# Patient Record
Sex: Male | Born: 1990
Health system: Southern US, Community
[De-identification: ages and names within clinical notes are randomized; demographics above are authoritative.]

## PROBLEM LIST (undated history)

## (undated) DIAGNOSIS — F1120 Opioid dependence, uncomplicated: Secondary | ICD-10-CM

## (undated) DIAGNOSIS — N200 Calculus of kidney: Secondary | ICD-10-CM

## (undated) DIAGNOSIS — F1111 Opioid abuse, in remission: Secondary | ICD-10-CM

## (undated) DIAGNOSIS — I2699 Other pulmonary embolism without acute cor pulmonale: Secondary | ICD-10-CM

## (undated) DIAGNOSIS — R300 Dysuria: Secondary | ICD-10-CM

## (undated) DIAGNOSIS — I10 Essential (primary) hypertension: Secondary | ICD-10-CM

## (undated) DIAGNOSIS — Z79891 Long term (current) use of opiate analgesic: Secondary | ICD-10-CM

## (undated) HISTORY — PX: ANKLE SURGERY: SHX546

## (undated) HISTORY — PX: JOINT REPLACEMENT: SHX530

---

## 2014-08-26 ENCOUNTER — Emergency Department (HOSPITAL_COMMUNITY)
Admission: EM | Admit: 2014-08-26 | Discharge: 2014-08-26 | Disposition: A | Discharge status: Home / Self Care | Payer: Medicaid - Out of State | Attending: Emergency Medicine | Admitting: Emergency Medicine

## 2014-08-26 ENCOUNTER — Encounter (HOSPITAL_COMMUNITY): Payer: Self-pay

## 2014-08-26 DIAGNOSIS — K029 Dental caries, unspecified: Secondary | ICD-10-CM | POA: Insufficient documentation

## 2014-08-26 DIAGNOSIS — K006 Disturbances in tooth eruption: Secondary | ICD-10-CM | POA: Insufficient documentation

## 2014-08-26 MED ORDER — BUPIVACAINE-EPINEPHRINE (PF) 0.5% -1:200000 IJ SOLN
1.8000 mL | Freq: Once | INTRAMUSCULAR | Status: AC
  Administered 2014-08-26: 1.8 mL
  Filled 2014-08-26: qty 1.8

## 2014-08-26 MED ORDER — IBUPROFEN 800 MG PO TABS: 800.0000 mg | ORAL_TABLET | Freq: Three times a day (TID) | ORAL | Status: DC

## 2014-08-26 MED ORDER — PENICILLIN V POTASSIUM 500 MG PO TABS: 500.0000 mg | ORAL_TABLET | Freq: Three times a day (TID) | ORAL | Status: DC

## 2014-08-26 NOTE — Discharge Instructions (Signed)

## 2014-08-26 NOTE — ED Provider Notes (Signed)
CSN: [id number]     Arrival date & time 08/26/14  1140 History  This chart was scribed for non-physician practitioner, Domenic Moras, PA-C,working with Virgel Manifold, MD, by Marlowe Kays, ED Scribe. This patient was seen in room WTR6/WTR6 and the patient's care was started at 12:51 PM.  Chief Complaint  Patient presents with  . Dental Pain   Patient is a 24 y.o. male presenting with tooth pain. The history is provided by the patient and medical records. No language interpreter was used.  Dental Pain Associated symptoms: facial swelling   Associated symptoms: no fever     HPI Comments:  Glen Lambert is a 24 y.o. male who presents to the Emergency Department complaining of severe right lower dental pain that began yesterday. Pt states approximately 1.5 years ago the cap came off from the tooth and he has not had any dental care since. He states he does not have a dentist. Pt rates the pain at 10/10. He has taken Ibuprofen for the pain with moderate relief of the pain. Chewing and cold air makes the pain worse. Ibuprofen has been alleviating the pain for short periods of time. Denies fever, chills, drainage or bleeding of the tooth, nausea or vomiting. Denies chewing tobacco. Endorses smoking 0.5 PPD of cigarettes.   No past medical history on file. Past Surgical History  Procedure Laterality Date  . Ankle surgery     No family history on file. History  Substance Use Topics  . Smoking status: Not on file  . Smokeless tobacco: Not on file  . Alcohol Use: Not on file    Review of Systems  Constitutional: Negative for fever and chills.  HENT: Positive for dental problem and facial swelling.   Gastrointestinal: Negative for nausea and vomiting.    Allergies  Review of patient's allergies indicates no known allergies.  Home Medications   Prior to Admission medications   Not on File   Triage Vitals: BP 144/98 mmHg  Pulse 88  Temp(Src) 99.7 F (37.6 C) (Oral)  Resp 18  SpO2  99% Physical Exam  Constitutional: He is oriented to person, place, and time. He appears well-developed and well-nourished.  HENT:  Head: Normocephalic and atraumatic.  Mouth/Throat: No trismus in the jaw. Abnormal dentition. Dental caries present. No dental abscesses.  Tooth #31 with significant dental decay. No erythema, swelling or drainage noted. No obvious abscess.  Eyes: EOM are normal.  Neck: Normal range of motion.  Cardiovascular: Normal rate.   Pulmonary/Chest: Effort normal.  Musculoskeletal: Normal range of motion.  Neurological: He is alert and oriented to person, place, and time.  Skin: Skin is warm and dry.  Psychiatric: He has a normal mood and affect. His behavior is normal.  Nursing note and vitals reviewed.   ED Course  NERVE BLOCK Date/Time: 08/26/2014 1:26 PM Performed by: Domenic Moras Authorized by: Domenic Moras Consent: Verbal consent obtained. Risks and benefits: risks, benefits and alternatives were discussed Consent given by: patient Patient understanding: patient states understanding of the procedure being performed Patient consent: the patient's understanding of the procedure matches consent given Patient identity confirmed: verbally with patient and arm band Time out: Immediately prior to procedure a "time out" was called to verify the correct patient, procedure, equipment, support staff and site/side marked as required. Indications: pain relief Body area: face/mouth Nerve: inferior alveolar Laterality: right Patient sedated: no Patient position: sitting Needle gauge: 27 G Local anesthetic: bupivacaine 0.5% with epinephrine Anesthetic total: 1.8 ml Outcome: pain improved   (  including critical care time) DIAGNOSTIC STUDIES: Oxygen Saturation is 99% on RA, normal by my interpretation.   COORDINATION OF CARE: 12:57 PM- Will administer dental block and prescribe antibiotic and pain medication. Will give dental referral. Pt verbalizes understanding  and agrees to plan.     Medications - No data to display  Labs Review Labs Reviewed - No data to display  Imaging Review No results found.   EKG Interpretation None      MDM   Final diagnoses:  Pain due to dental caries    BP 144/98 mmHg  Pulse 88  Temp(Src) 99.7 F (37.6 C) (Oral)  Resp 18  SpO2 99%   I personally performed the services described in this documentation, which was scribed in my presence. The recorded information has been reviewed and is accurate.    Domenic Moras, PA-C 08/26/14 [place], MD 08/26/14 [phone]

## 2014-08-26 NOTE — ED Notes (Signed)
Pt  C/o RT lower tooth pain since yesterday.  Swelling to face began this am.

## 2015-07-15 ENCOUNTER — Encounter (HOSPITAL_COMMUNITY): Payer: Self-pay | Admitting: Emergency Medicine

## 2015-07-15 ENCOUNTER — Emergency Department (HOSPITAL_COMMUNITY)
Admission: EM | Admit: 2015-07-15 | Discharge: 2015-07-15 | Disposition: A | Discharge status: Home / Self Care | Payer: No Typology Code available for payment source | Attending: Emergency Medicine | Admitting: Emergency Medicine

## 2015-07-15 DIAGNOSIS — S0592XA Unspecified injury of left eye and orbit, initial encounter: Secondary | ICD-10-CM | POA: Insufficient documentation

## 2015-07-15 DIAGNOSIS — Y998 Other external cause status: Secondary | ICD-10-CM | POA: Insufficient documentation

## 2015-07-15 DIAGNOSIS — Y92410 Unspecified street and highway as the place of occurrence of the external cause: Secondary | ICD-10-CM | POA: Insufficient documentation

## 2015-07-15 DIAGNOSIS — H5712 Ocular pain, left eye: Secondary | ICD-10-CM

## 2015-07-15 DIAGNOSIS — M545 Low back pain, unspecified: Secondary | ICD-10-CM

## 2015-07-15 DIAGNOSIS — Y9389 Activity, other specified: Secondary | ICD-10-CM | POA: Insufficient documentation

## 2015-07-15 DIAGNOSIS — Z791 Long term (current) use of non-steroidal anti-inflammatories (NSAID): Secondary | ICD-10-CM | POA: Diagnosis not present

## 2015-07-15 DIAGNOSIS — Z792 Long term (current) use of antibiotics: Secondary | ICD-10-CM | POA: Insufficient documentation

## 2015-07-15 DIAGNOSIS — S3992XA Unspecified injury of lower back, initial encounter: Secondary | ICD-10-CM | POA: Diagnosis not present

## 2015-07-15 MED ORDER — METHOCARBAMOL 500 MG PO TABS: 500.0000 mg | ORAL_TABLET | Freq: Two times a day (BID) | ORAL | Status: DC

## 2015-07-15 MED ORDER — IBUPROFEN 800 MG PO TABS
800.0000 mg | ORAL_TABLET | Freq: Once | ORAL | Status: DC
  Filled 2015-07-15: qty 1

## 2015-07-15 MED ORDER — TETRACAINE HCL 0.5 % OP SOLN
1.0000 [drp] | Freq: Once | OPHTHALMIC | Status: AC
  Administered 2015-07-15: 1 [drp] via OPHTHALMIC
  Filled 2015-07-15: qty 4

## 2015-07-15 MED ORDER — FLUORESCEIN SODIUM 1 MG OP STRP
1.0000 | ORAL_STRIP | Freq: Once | OPHTHALMIC | Status: AC
  Administered 2015-07-15: 1 via OPHTHALMIC
  Filled 2015-07-15: qty 1

## 2015-07-15 MED ORDER — IBUPROFEN 800 MG PO TABS: 800.0000 mg | ORAL_TABLET | Freq: Three times a day (TID) | ORAL | Status: DC
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
## 2015-07-15 NOTE — Discharge Instructions (Signed)
Take medications as prescribed. Return to the emergency room for worsening condition or new concerning symptoms. Follow up with your regular doctor. If you don't have a regular doctor use one of the numbers below to establish a primary care doctor. ° ° °Emergency Department Resource Guide °1) Find a Doctor and Pay Out of Pocket °Although you won't have to find out who is covered by your insurance plan, it is a good idea to ask around and get recommendations. You will then need to call the office and see if the doctor you have chosen will accept you as a new patient and what types of options they offer for patients who are self-pay. Some doctors offer discounts or will set up payment plans for their patients who do not have insurance, but you will need to ask so you aren't surprised when you get to your appointment. ° °2) Contact Your Local Health Department °Not all health departments have doctors that can see patients for sick visits, but many do, so it is worth a call to see if yours does. If you don't know where your local health department is, you can check in your phone book. The CDC also has a tool to help you locate your state's health department, and many state websites also have listings of all of their local health departments. ° °3) Find a Walk-in Clinic °If your illness is not likely to be very severe or complicated, you may want to try a walk in clinic. These are popping up all over the country in pharmacies, drugstores, and shopping centers. They're usually staffed by nurse practitioners or physician assistants that have been trained to treat common illnesses and complaints. They're usually fairly quick and inexpensive. However, if you have serious medical issues or chronic medical problems, these are probably not your best option. ° °No Primary Care Doctor: °- Call [facility] at  832-8000 - they can help you locate a primary care doctor that  accepts your insurance, provides certain services,  etc. °- Physician Referral Service- 1-800-533-3463 ° °Emergency Department Resource Guide °1) Find a Doctor and Pay Out of Pocket °Although you won't have to find out who is covered by your insurance plan, it is a good idea to ask around and get recommendations. You will then need to call the office and see if the doctor you have chosen will accept you as a new patient and what types of options they offer for patients who are self-pay. Some doctors offer discounts or will set up payment plans for their patients who do not have insurance, but you will need to ask so you aren't surprised when you get to your appointment. ° °2) Contact Your Local Health Department °Not all health departments have doctors that can see patients for sick visits, but many do, so it is worth a call to see if yours does. If you don't know where your local health department is, you can check in your phone book. The CDC also has a tool to help you locate your state's health department, and many state websites also have listings of all of their local health departments. ° °3) Find a Walk-in Clinic °If your illness is not likely to be very severe or complicated, you may want to try a walk in clinic. These are popping up all over the country in pharmacies, drugstores, and shopping centers. They're usually staffed by nurse practitioners or physician assistants that have been trained to treat common illnesses and complaints. They're usually fairly   quick and inexpensive. However, if you have serious medical issues or chronic medical problems, these are probably not your best option. ° °No Primary Care Doctor: °- Call [facility] at  832-8000 - they can help you locate a primary care doctor that  accepts your insurance, provides certain services, etc. °- Physician Referral Service- 1-800-533-3463 ° °Chronic Pain Problems: °Organization         Address  Phone   Notes  °[facility]  ([phone]) [phone] Patients need to be referred by  their primary care doctor.  ° °Medication Assistance: °Organization         Address  Phone   Notes  °[place] [facility] [address] °[place], [place] [address] [phone]) [phone] --Must be a resident of [place] °-- Must have NO insurance coverage whatsoever (no Medicaid/ Medicare, etc.) °-- The pt. MUST have a primary care doctor that directs their care regularly and follows them in the community °  °[facility]  (866) 331-1348   °[employer]  (888) 892-1162   ° °Agencies that provide inexpensive medical care: °Organization         Address  Phone   Notes  °[facility]  ([phone]) [phone]   °[facility]    (336) 832-7272   °[facility] [address] °[place], [place] [address] ([phone]) 832-4777   °[facility] [address], °[place] ([phone]) [phone]   °[facility]    (336) 373-0678   °[facility]    (336) 272-1050   °[facility] ° [address], [place] Phone:  (336) [phone], Fax:  (336) [phone] Hours of Operation:  9 am - 6 pm, M-F.  Also accepts Medicaid/Medicare and self-pay.  °[facility] ° [address], [place] Phone: (336) [phone], Fax: ([phone]) [phone]. Hours of Operation:  8:30 am - 5:30 pm, M-F.  Also accepts Medicaid and self-pay.  °[facility] [place] [address], [place] Phone: ([phone]) [phone]   °[facility] [address], [place], [place] (336)723-1848, Ext. 123 Mondays & Thursdays: 7-9 AM.  First 15 patients are seen on a first come, first serve basis. °  ° °Medicaid-accepting [place] Providers: ° °Organization         Address  Phone   Notes  °[facility] [address], [place] ([phone]) [phone] Also accepts self-pay patients.  °[facility] [address], [place] ° ([phone]) [phone]   °[facility]  [address], [place] ([phone]) [phone]   °[facility] [address] [place] [address], [place] (336) [phone]   °[facility] [address], [place]  ° ([phone]) [phone] Only accepts [employer] Medicaid patients after they have their name applied to their card.  ° °Self-Pay (no insurance) in [place]: ° °Organization         Address  Phone   Notes  °Sickle Cell Patients, [facility] [address], [place] ([phone]) [phone]   °[facility] [address], [place] ([phone]) [phone]   °[facility] [place] ° [address], [place] ([phone]) [phone]   °[facility]/Dr. Osei-Bonsu ° [address] [place] [address],  or [address], [place] [phone]) [phone] Phone number   for both [place] and [place] locations is the same.  °[facility] [address], [place] ([phone]) [phone]   °[facility] [address] [place] [address], [place] or [address] ([phone]) [phone] °([phone]) [phone]   °[facility] [address], [place] (336) [phone], phone; ([phone]) [phone], fax Sees patients 1st and 3rd Saturday of every month.  Must not qualify for public or private insurance (i.e. Medicaid, Medicare, [place] Health Choice, Veterans' Benefits) • Household income should be no more than 200% of the poverty level •The clinic cannot treat you if you are pregnant or think you are pregnant • Sexually transmitted diseases are not treated at the clinic.  ° ° ° °
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
## 2016-04-25 ENCOUNTER — Emergency Department (HOSPITAL_COMMUNITY): Payer: Self-pay

## 2016-04-25 ENCOUNTER — Inpatient Hospital Stay (HOSPITAL_COMMUNITY)
Admission: EM | Admit: 2016-04-25 | Discharge: 2016-04-29 | DRG: 175 | Disposition: A | Discharge status: Home / Self Care | Payer: Self-pay | Attending: Internal Medicine | Admitting: Internal Medicine

## 2016-04-25 DIAGNOSIS — Z86711 Personal history of pulmonary embolism: Secondary | ICD-10-CM | POA: Diagnosis present

## 2016-04-25 DIAGNOSIS — F1120 Opioid dependence, uncomplicated: Secondary | ICD-10-CM

## 2016-04-25 DIAGNOSIS — Z79891 Long term (current) use of opiate analgesic: Secondary | ICD-10-CM

## 2016-04-25 DIAGNOSIS — Z720 Tobacco use: Secondary | ICD-10-CM

## 2016-04-25 DIAGNOSIS — M79669 Pain in unspecified lower leg: Secondary | ICD-10-CM | POA: Diagnosis present

## 2016-04-25 DIAGNOSIS — R000 Tachycardia, unspecified: Secondary | ICD-10-CM | POA: Diagnosis present

## 2016-04-25 DIAGNOSIS — F17213 Nicotine dependence, cigarettes, with withdrawal: Secondary | ICD-10-CM | POA: Diagnosis present

## 2016-04-25 DIAGNOSIS — I2699 Other pulmonary embolism without acute cor pulmonale: Principal | ICD-10-CM | POA: Diagnosis present

## 2016-04-25 DIAGNOSIS — F1111 Opioid abuse, in remission: Secondary | ICD-10-CM | POA: Diagnosis present

## 2016-04-25 DIAGNOSIS — J9601 Acute respiratory failure with hypoxia: Secondary | ICD-10-CM | POA: Diagnosis present

## 2016-04-25 DIAGNOSIS — R042 Hemoptysis: Secondary | ICD-10-CM | POA: Diagnosis present

## 2016-04-25 DIAGNOSIS — R079 Chest pain, unspecified: Secondary | ICD-10-CM | POA: Diagnosis present

## 2016-04-25 DIAGNOSIS — D649 Anemia, unspecified: Secondary | ICD-10-CM | POA: Diagnosis present

## 2016-04-25 HISTORY — DX: Opioid abuse, in remission: F11.11

## 2016-04-25 HISTORY — DX: Long term (current) use of opiate analgesic: Z79.891

## 2016-04-25 HISTORY — DX: Opioid dependence, uncomplicated: F11.20

## 2016-04-25 NOTE — ED Notes (Signed)
MD Pollina at bedside. 

## 2016-04-25 NOTE — ED Notes (Signed)
Patient arrives via EMS from home with complaint of chest pain as Code STEMI. Onset of pain was yesterday. The pain resolved last night and patient was able to go to sleep. Today the pain commenced and he began to feel very short of breath. EMS found patient exhibiting Levine's sign, tachycardic, and hypertensive. PTA they administered 324mg  ASA and 1 SL NTG 0.4mg  tab. Patient's chest pain was reduced after NTG administration and patient's VS improved. Initial BP per EMS was 167/111 with HR @ 135.

## 2016-04-25 NOTE — ED Provider Notes (Signed)
[facility] DEPT Provider Note   CSN: [id number] Arrival date & time: 04/25/16  2334  By signing my name below, I, [name], attest that this documentation has been prepared under the direction and in the presence of Orpah Greek, MD. Electronically Signed: [name], ED Scribe. 04/25/16. 11:49 PM.    History   Chief Complaint Chief Complaint  Patient presents with  . Code STEMI    HPI Glen Lambert is a 25 y.o. male brought in by ambulance who presents to the Emergency Department complaining of moderate, left-sided CP with radiation to the left shoulder that initially began yesterday and worsened this morning upon waking up. Pt states his CP gradually worsened throughout the day today and he also experienced SOB. He states his pain is worsened with breathing. Per EMS, pt was hypertensive (169/111), tachycardic (130 bpm) and hypoxic (low 90s) on their arrival. EMS states pt was placed on 4L O2 with improvement of his SpO2 to 98% en route. Pt was given NTG and ASA en route with temporary relief of his CP, per EMS. EMS also states pts BP improved to 129/80 after NTG administration. No FHx early heart disease. No h/o cardiac disease, MI. Pt denies nausea, vomiting, diarrhea, diaphoresis, cough. Pt states he is a current occasional smoker and drinker. He denies illicit drug use. He is currently taking Methadone.     The history is provided by the patient and the EMS personnel. No language interpreter was used.    Past Medical History:  Diagnosis Date  . 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 Opioid abuse, in remission     Patient Active Problem List   Diagnosis Date Noted  . PE (pulmonary thromboembolism) ([place]) 04/26/2016    Past Surgical History:  Procedure Laterality Date  . ANKLE SURGERY         Home Medications    Prior to Admission medications   Medication Sig Start Date End Date Taking? Authorizing Provider  methadone (DOLOPHINE) 10 MG/ML  solution Take 90 mg by mouth every 8 (eight) hours.   Yes Historical Provider, MD  naproxen sodium (ANAPROX) 220 MG tablet Take 220 mg by mouth 2 (two) times daily as needed (for pain).   Yes Historical Provider, MD  sodium-potassium bicarbonate (ALKA-SELTZER GOLD) TBEF dissolvable tablet Take 1 tablet by mouth daily as needed (for pain).   Yes Historical Provider, MD  ibuprofen (ADVIL,MOTRIN) 800 MG tablet Take 1 tablet (800 mg total) by mouth 3 (three) times daily. Patient not taking: Reported on 04/25/2016 08/26/14   Domenic Moras, PA-C  ibuprofen (ADVIL,MOTRIN) 800 MG tablet Take 1 tablet (800 mg total) by mouth 3 (three) times daily. Patient not taking: Reported on 04/25/2016 07/15/15   Olivia Canter Sam, PA-C  methocarbamol (ROBAXIN) 500 MG tablet Take 1 tablet (500 mg total) by mouth 2 (two) times daily. Patient not taking: Reported on 04/25/2016 07/15/15   Olivia Canter Sam, PA-C  penicillin v potassium (VEETID) 500 MG tablet Take 1 tablet (500 mg total) by mouth 3 (three) times daily. Patient not taking: Reported on 04/25/2016 08/26/14   Domenic Moras, PA-C    Family History History reviewed. No pertinent family history.  Social History Social History  Substance Use Topics  . Smoking status: Current Every Day Smoker  . Smokeless tobacco: Never Used  . Alcohol use Yes     Comment: Occasional     Allergies   Review of patient's allergies indicates no known allergies.   Review of Systems Review  of Systems  Constitutional: Negative for diaphoresis.  Respiratory: Positive for shortness of breath. Negative for cough.   Cardiovascular: Positive for chest pain.  Gastrointestinal: Negative for diarrhea, nausea and vomiting.  All other systems reviewed and are negative.    Physical Exam Updated Vital Signs BP 139/81   Pulse 81   Temp 101 F (38.3 C) (Oral)   Resp (!) 31   Ht 6\' 2"  (1.88 m)   Wt 255 lb (115.7 kg)   SpO2 98%   BMI 32.74 kg/m   Physical Exam  Constitutional: He is oriented to  person, place, and time. He appears well-developed and well-nourished. No distress.  HENT:  Head: Normocephalic and atraumatic.  Right Ear: Hearing normal.  Left Ear: Hearing normal.  Nose: Nose normal.  Mouth/Throat: Oropharynx is clear and moist and mucous membranes are normal.  Eyes: Conjunctivae and EOM are normal. Pupils are equal, round, and reactive to light.  Neck: Normal range of motion. Neck supple.  Cardiovascular: Regular rhythm and normal heart sounds.  Tachycardia present.  Exam reveals no gallop and no friction rub.   No murmur heard. Pulmonary/Chest: Effort normal and breath sounds normal. No respiratory distress. He exhibits no tenderness.  Abdominal: Soft. Normal appearance and bowel sounds are normal. There is no hepatosplenomegaly. There is no tenderness. There is no rebound, no guarding, no tenderness at McBurney's point and negative Murphy's sign. No hernia.  Musculoskeletal: Normal range of motion.  Neurological: He is alert and oriented to person, place, and time. He has normal strength. No cranial nerve deficit or sensory deficit. Coordination normal. GCS eye subscore is 4. GCS verbal subscore is 5. GCS motor subscore is 6.  Skin: Skin is warm and dry. No rash noted.  Psychiatric: He has a normal mood and affect. His behavior is normal. Thought content normal.  Nursing note and vitals reviewed.    ED Treatments / Results   DIAGNOSTIC STUDIES: Oxygen Saturation is 98% on [place] 4L, normal by my interpretation.    COORDINATION OF CARE: 11:41 PM Discussed treatment plan with pt at bedside which includes EKG and pt agreed to plan.  11:49 PM Consulted with cardiology, who recommends cardiac work-up, troponin.      Labs (all labs ordered are listed, but only abnormal results are displayed) Labs Reviewed  CBC WITH DIFFERENTIAL/PLATELET - Abnormal; Notable for the following:       Result Value   Hemoglobin 12.6 (*)    HCT 37.1 (*)    All other components within  normal limits  COMPREHENSIVE METABOLIC PANEL - Abnormal; Notable for the following:    Glucose, Bld 119 (*)    All other components within normal limits  PROTIME-INR  BRAIN NATRIURETIC PEPTIDE  RAPID URINE DRUG SCREEN, HOSP PERFORMED  ANTITHROMBIN III  PROTEIN C ACTIVITY  PROTEIN C, TOTAL  PROTEIN S ACTIVITY  PROTEIN S, TOTAL  LUPUS ANTICOAGULANT PANEL  BETA-2-GLYCOPROTEIN I ABS, IGG/M/A  HOMOCYSTEINE  FACTOR 5 LEIDEN  PROTHROMBIN GENE MUTATION  CARDIOLIPIN ANTIBODIES, IGG, IGM, IGA  I-STAT TROPOININ, ED    EKG  EKG Interpretation  Date/Time:  Friday April 25 2016 23:41:18 EDT Ventricular Rate:  111 PR Interval:    QRS Duration: 88 QT Interval:  315 QTC Calculation: 428 R Axis:   9 Text Interpretation:  Sinus tachycardia Borderline ST elevation, anterior leads Baseline wander in lead(s) V1 No previous tracing Confirmed by Betsey Holiday  MD, CHRISTOPHER [id number]) on 04/26/2016 12:23:14 AM       Radiology Ct Angio Chest  Pe W Or Wo Contrast  Result Date: 04/26/2016 CLINICAL DATA:  Chest pain and shortness of breath EXAM: CT ANGIOGRAPHY CHEST WITH CONTRAST TECHNIQUE: Multidetector CT imaging of the chest was performed using the standard protocol during bolus administration of intravenous contrast. Multiplanar CT image reconstructions and MIPs were obtained to evaluate the vascular anatomy. CONTRAST:  100 mL Isovue 370 IV COMPARISON:  Chest radiograph 04/26/2016 better contrast 100 mL Isovue 370 IV FINDINGS: Cardiovascular: There is satisfactory opacification of the pulmonary arteries to the segmental level. There is non opacification of the left lower lobe posterior and medial basal segment pulmonary arteries. Additionally, there is a suspected filling defect within a subsegmental branch of the anterior basal segment left lower lobe pulmonary artery. The remainder of the pulmonary arterial tree is normal. The main pulmonary artery is upper limits normal for size, measuring 2.9 cm at the  bifurcation. There is no CT evidence of acute right heart strain. The visualized aorta is normal. The RV:LV ratio measures approximately 0.8. There is a normal 3-vessel arch branching pattern. Heart size is normal, without pericardial effusion. Mediastinum/Nodes: No mediastinal, hilar or axillary lymphadenopathy. The visualized thyroid and thoracic esophageal course are unremarkable. Lungs/Pleura: There are bibasilar opacities, left greater than right. No pneumothorax or pleural effusion. No pulmonary nodules or masses. Upper Abdomen: Contrast bolus timing is not optimized for evaluation of the abdominal organs. Within this limitation, the visualized organs of the upper abdomen are normal. Musculoskeletal: No chest wall abnormality. No acute or significant osseous findings. Bilateral gynecomastia. Review of the MIP images confirms the above findings. IMPRESSION: 1. Pulmonary emboli within the left lower lobe posterior basal and medial basal segmental pulmonary arteries and suspected embolus within a subsegmental branch of the left lower lobe anterior basal segmental artery. 2. No evidence of right heart strain. 3. Left basilar airspace opacities, likely early pulmonary infarct. These results were called by telephone at the time of interpretation on 04/26/2016 at 5:06 am to Dr. Joseph Berkshire , who verbally acknowledged these results. Electronically Signed   By: Ulyses Jarred M.D.   On: 04/26/2016 05:10   Dg Chest Port 1 View  Result Date: 04/26/2016 CLINICAL DATA:  Acute onset of generalized chest pain. Code ST-elevation myocardial infarction. Severe shortness of breath. Initial encounter. EXAM: PORTABLE CHEST 1 VIEW COMPARISON:  None. FINDINGS: The lungs are mildly hypoexpanded. Mild bibasilar opacity may reflect atelectasis or possibly minimal interstitial edema. There is no evidence of pleural effusion or pneumothorax. The cardiomediastinal silhouette is borderline normal in size. No acute osseous  abnormalities are seen. IMPRESSION: Lungs mildly hypoexpanded. Mild bibasilar opacity may reflect atelectasis or possibly minimal interstitial edema. Electronically Signed   By: Garald Balding M.D.   On: 04/26/2016 00:44    Procedures Procedures (including critical care time)  Medications Ordered in ED Medications  iopamidol (ISOVUE-370) 76 % injection (not administered)  gi cocktail (Maalox,Lidocaine,Donnatal) (30 mLs Oral Given 04/26/16 0412)     Initial Impression / Assessment and Plan / ED Course  I have reviewed the triage vital signs and the nursing notes.  Pertinent labs & imaging results that were available during my care of the patient were reviewed by me and considered in my medical decision making (see chart for details).  Clinical Course    Patient presents to the emergency department for evaluation of chest pain. Patient brought in as a code STEMI by EMS. Review of the EKG did reveal elevations in V1 and V2, but there were no reciprocal changes. This  was reviewed with Dr. Einar Gip, who did agree that this was not consistent with STEMI. He recommended routine workup. Patient was noted to be mildly hypoxic for EMS and had a sharp and pleuritic component to his pain. CT angiography was therefore performed to further evaluate. This did show evidence of pulmonary embolism. Patient does have a low-grade fever here in the ER 2, likely secondary to slight pulmonary infarct. Patient will be placed on heparin and admitted to the hospitalist service.  Final Clinical Impressions(s) / ED Diagnoses   Final diagnoses:  PE (pulmonary thromboembolism) ([place])    New Prescriptions New Prescriptions   No medications on file    I personally performed the services described in this documentation, which was scribed in my presence. The recorded information has been reviewed and is accurate.    Orpah Greek, MD 04/26/16 [phone]

## 2016-04-25 NOTE — ED Notes (Signed)
Blood collected by Gwynne Edinger.

## 2016-04-26 ENCOUNTER — Emergency Department (HOSPITAL_COMMUNITY): Payer: Self-pay

## 2016-04-26 ENCOUNTER — Encounter (HOSPITAL_COMMUNITY): Payer: Self-pay | Admitting: Emergency Medicine

## 2016-04-26 DIAGNOSIS — Z720 Tobacco use: Secondary | ICD-10-CM

## 2016-04-26 DIAGNOSIS — I2699 Other pulmonary embolism without acute cor pulmonale: Secondary | ICD-10-CM | POA: Diagnosis present

## 2016-04-26 DIAGNOSIS — Z86711 Personal history of pulmonary embolism: Secondary | ICD-10-CM | POA: Diagnosis present

## 2016-04-26 DIAGNOSIS — F1111 Opioid abuse, in remission: Secondary | ICD-10-CM

## 2016-04-26 DIAGNOSIS — R079 Chest pain, unspecified: Secondary | ICD-10-CM | POA: Diagnosis present

## 2016-04-26 DIAGNOSIS — F1120 Opioid dependence, uncomplicated: Secondary | ICD-10-CM

## 2016-04-26 LAB — CBC WITH DIFFERENTIAL/PLATELET
BASOS ABS: 0 10*3/uL (ref 0.0–0.1)
Basophils Relative: 0 %
Eosinophils Absolute: 0.1 10*3/uL (ref 0.0–0.7)
Eosinophils Relative: 1 %
HEMATOCRIT: 37.1 % — AB (ref 39.0–52.0)
HEMOGLOBIN: 12.6 g/dL — AB (ref 13.0–17.0)
Lymphocytes Relative: 25 %
Lymphs Abs: 1.9 10*3/uL (ref 0.7–4.0)
MCH: 29.4 pg (ref 26.0–34.0)
MCHC: 34 g/dL (ref 30.0–36.0)
MCV: 86.7 fL (ref 78.0–100.0)
Monocytes Absolute: 0.5 10*3/uL (ref 0.1–1.0)
Monocytes Relative: 6 %
NEUTROS ABS: 5 10*3/uL (ref 1.7–7.7)
Neutrophils Relative %: 68 %
Platelets: 218 10*3/uL (ref 150–400)
RBC: 4.28 MIL/uL (ref 4.22–5.81)
RDW: 13.2 % (ref 11.5–15.5)
WBC: 7.4 10*3/uL (ref 4.0–10.5)

## 2016-04-26 LAB — ANTITHROMBIN III: AntiThromb III Func: 113 % (ref 75–120)

## 2016-04-26 LAB — I-STAT TROPONIN, ED: Troponin i, poc: 0 ng/mL (ref 0.00–0.08)

## 2016-04-26 LAB — COMPREHENSIVE METABOLIC PANEL
ALBUMIN: 3.8 g/dL (ref 3.5–5.0)
ALT: 21 U/L (ref 17–63)
AST: 23 U/L (ref 15–41)
Alkaline Phosphatase: 50 U/L (ref 38–126)
Anion gap: 7 (ref 5–15)
BILIRUBIN TOTAL: 0.5 mg/dL (ref 0.3–1.2)
BUN: 10 mg/dL (ref 6–20)
CO2: 28 mmol/L (ref 22–32)
CREATININE: 0.92 mg/dL (ref 0.61–1.24)
Calcium: 9.4 mg/dL (ref 8.9–10.3)
Chloride: 105 mmol/L (ref 101–111)
GFR calc Af Amer: >60 mL/min (ref >60)
GFR calc non Af Amer: >60 mL/min (ref >60)
GLUCOSE: 119 mg/dL — AB (ref 65–99)
Potassium: 3.6 mmol/L (ref 3.5–5.1)
Sodium: 140 mmol/L (ref 135–145)
Total Protein: 6.5 g/dL (ref 6.5–8.1)

## 2016-04-26 LAB — BRAIN NATRIURETIC PEPTIDE: B Natriuretic Peptide: 11.4 pg/mL (ref 0.0–100.0)

## 2016-04-26 LAB — RAPID URINE DRUG SCREEN, HOSP PERFORMED
AMPHETAMINES: NOT DETECTED
BARBITURATES: NOT DETECTED
Benzodiazepines: NOT DETECTED
COCAINE: NOT DETECTED
Opiates: NOT DETECTED
TETRAHYDROCANNABINOL: NOT DETECTED

## 2016-04-26 LAB — HEPARIN LEVEL (UNFRACTIONATED)
HEPARIN UNFRACTIONATED: 0.71 [IU]/mL — AB (ref 0.30–0.70)
HEPARIN UNFRACTIONATED: 0.48 [IU]/mL (ref 0.30–0.70)

## 2016-04-26 LAB — PROTIME-INR
INR: 0.98
PROTHROMBIN TIME: 13 s (ref 11.4–15.2)

## 2016-04-26 LAB — TROPONIN I: Troponin I: <0.03 ng/mL (ref <0.03)

## 2016-04-26 MED ORDER — NAPROXEN SODIUM 275 MG PO TABS
275.0000 mg | ORAL_TABLET | Freq: Three times a day (TID) | ORAL | Status: DC | PRN
  Administered 2016-04-26 – 2016-04-28 (×6): 275 mg via ORAL
  Filled 2016-04-26 (×10): qty 1

## 2016-04-26 MED ORDER — ONDANSETRON HCL 4 MG/2ML IJ SOLN: 4.0000 mg | Freq: Four times a day (QID) | INTRAMUSCULAR | Status: DC | PRN

## 2016-04-26 MED ORDER — ZOLPIDEM TARTRATE 5 MG PO TABS
5.0000 mg | ORAL_TABLET | Freq: Every evening | ORAL | Status: DC | PRN
  Administered 2016-04-26 – 2016-04-27 (×2): 5 mg via ORAL
  Filled 2016-04-26 (×2): qty 1

## 2016-04-26 MED ORDER — METHADONE HCL 10 MG PO TABS
90.0000 mg | ORAL_TABLET | Freq: Every day | ORAL | Status: DC
  Administered 2016-04-26 – 2016-04-29 (×4): 90 mg via ORAL
  Filled 2016-04-26 (×4): qty 9

## 2016-04-26 MED ORDER — SODIUM CHLORIDE 0.9 % IV SOLN
INTRAVENOUS | Status: DC
  Administered 2016-04-26 – 2016-04-27 (×3): via INTRAVENOUS

## 2016-04-26 MED ORDER — HEPARIN BOLUS VIA INFUSION
6000.0000 [IU] | Freq: Once | INTRAVENOUS | Status: AC
Start: 2016-04-26 — End: 2016-04-26
  Administered 2016-04-26: 6000 [IU] via INTRAVENOUS
  Filled 2016-04-26: qty 6000

## 2016-04-26 MED ORDER — IOPAMIDOL (ISOVUE-370) INJECTION 76%
100.0000 mL | Freq: Once | INTRAVENOUS | Status: AC | PRN
  Administered 2016-04-25: 100 mL via INTRAVENOUS

## 2016-04-26 MED ORDER — ALPRAZOLAM 0.25 MG PO TABS
0.2500 mg | ORAL_TABLET | Freq: Two times a day (BID) | ORAL | Status: DC | PRN
  Administered 2016-04-28: 0.25 mg via ORAL
  Filled 2016-04-26 (×3): qty 1

## 2016-04-26 MED ORDER — ACETAMINOPHEN 325 MG PO TABS
650.0000 mg | ORAL_TABLET | ORAL | Status: DC | PRN
  Administered 2016-04-28: 650 mg via ORAL
  Filled 2016-04-26: qty 2

## 2016-04-26 MED ORDER — GI COCKTAIL ~~LOC~~
30.0000 mL | Freq: Four times a day (QID) | ORAL | Status: DC | PRN
  Administered 2016-04-26 – 2016-04-28 (×8): 30 mL via ORAL
  Filled 2016-04-26 (×8): qty 30

## 2016-04-26 MED ORDER — NAPROXEN SODIUM 275 MG PO TABS
275.0000 mg | ORAL_TABLET | Freq: Two times a day (BID) | ORAL | Status: DC | PRN
  Administered 2016-04-26: 275 mg via ORAL
  Filled 2016-04-26 (×2): qty 1

## 2016-04-26 MED ORDER — HEPARIN (PORCINE) IN NACL 100-0.45 UNIT/ML-% IJ SOLN
1650.0000 [IU]/h | INTRAMUSCULAR | Status: DC
  Administered 2016-04-26: 1700 [IU]/h via INTRAVENOUS
  Administered 2016-04-26: 1600 [IU]/h via INTRAVENOUS
  Filled 2016-04-26 (×4): qty 250

## 2016-04-26 MED ORDER — IOPAMIDOL (ISOVUE-370) INJECTION 76%
INTRAVENOUS | Status: AC
  Filled 2016-04-26: qty 100

## 2016-04-26 MED ORDER — NICOTINE 21 MG/24HR TD PT24
21.0000 mg | MEDICATED_PATCH | Freq: Every day | TRANSDERMAL | Status: DC
  Administered 2016-04-26 – 2016-04-29 (×4): 21 mg via TRANSDERMAL
  Filled 2016-04-26 (×4): qty 1

## 2016-04-26 MED ORDER — GI COCKTAIL ~~LOC~~
30.0000 mL | Freq: Once | ORAL | Status: AC
  Administered 2016-04-26: 30 mL via ORAL
  Filled 2016-04-26: qty 30

## 2016-04-26 NOTE — H&P (Signed)
History and Physical    Glen Lambert [id number] DOB: May 25, 1991 DOA: 04/25/2016   PCP: No PCP Per Patient   Patient coming from:  Home   Chief Complaint: Chest Pain    HPI: Glen Lambert is a 25 y.o. male  presenting with 2 day history of  Progressive left sided  chest pain since 3 am without radiation, constant, worsened with deep inspiration, movement or exertion. Per EMS, pt was hypertensive (169/111), tachycardic (130 bpm) and hypoxic (low 90s) on their arrival. EMS states pt was placed on 4L O2 with improvement of his SpO2 to 98% en route. Pain did not resolve with ASA, GI cocktail or Nitro. Denies any dizziness or falls. No syncope or presyncope. He reports intermittent  episodes of blood tinged sputum, last one prior to admission. Denies any fever or chills. Denies any nausea, vomiting or abdominal pain. Appetite is normal. Denies any leg swelling but does report Left ankle and lower calf pain . Denies any headaches or vision changes. Denies any seizures No confusion reported. Never seen by a cardiologist  No recent long distance trips. Denies any new stressors. No new meds. Not on hormonal therapy. No new herbal supplements.He smokes 1 ppd of cig. No ETOH or recreational drugs. He is on chronic methadone followed at the [facility] clinic Patient is very active, lifts heavy boxes at work, at [place] history of PE in grandfather Denies history of sickle cell disease or trait   ED Course:  BP 144/77   Pulse (!) 155   Temp 101 F (38.3 C) (Oral)   Resp (!) 29   Ht 6\' 2"  (1.88 m)   Wt 115.7 kg (255 lb)   SpO2 92%   BMI 32.74 kg/m   Hb 12.6 CMET normal  BNP 11.4 Hypercoagulable panel pending EKG Sinus tachycardia Borderline ST elevation, anterior leads Baseline wander in lead(s) V1  Tn 0 UDS negative  CT angio chest : Pulmonary emboli within the left lower lobe posterior basal and medial basal segmental pulmonary arteries and suspected embolus within a subsegmental branch of the  left lower lobe anterior basal segmental artery.. No evidence of right heart strain. Left basilar airspace opacities, likely early pulmonary infarct. Cardiology was consulted on the phone ruled out STEMI as findings were not consistent with it.   Review of Systems: As per HPI otherwise 10 point review of systems negative.   Past Medical History:  Diagnosis Date  . 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)   . Opioid abuse, in remission     Past Surgical History:  Procedure Laterality Date  . ANKLE SURGERY      Social History Social History   Social History  . Marital status: Single    Spouse name: N/A  . Number of children: N/A  . Years of education: N/A   Occupational History  . Not on file.   Social History Main Topics  . Smoking status: Current Every Day Smoker    Packs/day: 1.00    Types: Cigarettes  . Smokeless tobacco: Never Used  . Alcohol use Yes     Comment: Occasional  . Drug use: No     Comment: Hx of Opioid abuse.   [place] Sexual activity: Yes   Other Topics Concern  . Not on file   Social History Narrative  . No narrative on file     No Known Allergies  Family History  Problem Relation Age of Onset  . Pulmonary embolism Maternal Grandfather  Prior to Admission medications   Medication Sig Start Date End Date Taking? Authorizing Provider  methadone (DOLOPHINE) 10 MG/ML solution Take 90 mg by mouth every 8 (eight) hours.   Yes Historical Provider, MD  naproxen sodium (ANAPROX) 220 MG tablet Take 220 mg by mouth 2 (two) times daily as needed (for pain).   Yes Historical Provider, MD  sodium-potassium bicarbonate (ALKA-SELTZER GOLD) TBEF dissolvable tablet Take 1 tablet by mouth daily as needed (for pain).   Yes Historical Provider, MD  ibuprofen (ADVIL,MOTRIN) 800 MG tablet Take 1 tablet (800 mg total) by mouth 3 (three) times daily. Patient not taking: Reported on 04/25/2016 08/26/14   Domenic Moras, PA-C  ibuprofen (ADVIL,MOTRIN) 800 MG tablet  Take 1 tablet (800 mg total) by mouth 3 (three) times daily. Patient not taking: Reported on 04/25/2016 07/15/15   Olivia Canter Sam, PA-C  methocarbamol (ROBAXIN) 500 MG tablet Take 1 tablet (500 mg total) by mouth 2 (two) times daily. Patient not taking: Reported on 04/25/2016 07/15/15   Olivia Canter Sam, PA-C  penicillin v potassium (VEETID) 500 MG tablet Take 1 tablet (500 mg total) by mouth 3 (three) times daily. Patient not taking: Reported on 04/25/2016 08/26/14   Domenic Moras, PA-C    Physical Exam:    Vitals:   04/26/16 0545 04/26/16 0615 04/26/16 0630 04/26/16 0645  BP: 131/72 139/81 124/74 144/77  Pulse: 69 81 83 (!) 155  Resp: 23 (!) 31 (!) 33 (!) 29  Temp:      TempSrc:      SpO2: 97% 98% 95% 92%  Weight:      Height:           Constitutional: NAD, calm, uncomfortable due to left chest pain  Vitals:   04/26/16 0545 04/26/16 0615 04/26/16 0630 04/26/16 0645  BP: 131/72 139/81 124/74 144/77  Pulse: 69 81 83 (!) 155  Resp: 23 (!) 31 (!) 33 (!) 29  Temp:      TempSrc:      SpO2: 97% 98% 95% 92%  Weight:      Height:       Eyes: PERRL, lids and conjunctivae normal ENMT: Mucous membranes are moist. Posterior pharynx clear of any exudate or lesions.Normal dentition.  Neck: normal, supple, no masses, no thyromegaly Respiratory: clear to auscultation bilaterally, no wheezing, no crackles. Normal respiratory effort. No accessory muscle use.  Cardiovascular:  Tachy  rate and rhythm, no murmurs / rubs / gallops. No extremity edema. 2+ pedal pulses. No carotid bruits.  Abdomen: no tenderness, no masses palpated. No hepatosplenomegaly. Bowel sounds positive.  Musculoskeletal: no clubbing / cyanosis .left ankle joint deformity, with no other deformities  upper and lower extremities. Good ROM, no contractures. Normal muscle tone. No apparent Homan's sign Skin: no rashes, lesions, ulcers.  Neurologic: CN 2-12 grossly intact. Sensation intact, DTR normal. Strength 5/5 in all 4.      Labs  on Admission: I have personally reviewed following labs and imaging studies  CBC:  Recent Labs Lab 04/25/16 2349  WBC 7.4  NEUTROABS 5.0  HGB 12.6*  HCT 37.1*  MCV 86.7  PLT 99991111    Basic Metabolic Panel:  Recent Labs Lab 04/25/16 2349  NA 140  K 3.6  CL 105  CO2 28  GLUCOSE 119*  BUN 10  CREATININE 0.92  CALCIUM 9.4    GFR: Estimated Creatinine Clearance: 166 mL/min (by C-G formula based on SCr of 0.92 mg/dL).  Liver Function Tests:  Recent Labs Lab 04/25/16 2349  AST 23  ALT 21  ALKPHOS 50  BILITOT 0.5  PROT 6.5  ALBUMIN 3.8   No results for input(s): LIPASE, AMYLASE in the last 168 hours. No results for input(s): AMMONIA in the last 168 hours.  Coagulation Profile:  Recent Labs Lab 04/25/16 2349  INR 0.98    Cardiac Enzymes: No results for input(s): CKTOTAL, CKMB, CKMBINDEX, TROPONINI in the last 168 hours.  BNP (last 3 results) No results for input(s): PROBNP in the last 8760 hours.  HbA1C: No results for input(s): HGBA1C in the last 72 hours.  CBG: No results for input(s): GLUCAP in the last 168 hours.  Lipid Profile: No results for input(s): CHOL, HDL, LDLCALC, TRIG, CHOLHDL, LDLDIRECT in the last 72 hours.  Thyroid Function Tests: No results for input(s): TSH, T4TOTAL, FREET4, T3FREE, THYROIDAB in the last 72 hours.  Anemia Panel: No results for input(s): VITAMINB12, FOLATE, FERRITIN, TIBC, IRON, RETICCTPCT in the last 72 hours.  Urine analysis: No results found for: COLORURINE, APPEARANCEUR, LABSPEC, PHURINE, GLUCOSEU, HGBUR, BILIRUBINUR, KETONESUR, PROTEINUR, UROBILINOGEN, NITRITE, LEUKOCYTESUR  Sepsis Labs: @LABRCNTIP (procalcitonin:4,lacticidven:4) )No results found for this or any previous visit (from the past 240 hour(s)).   Radiological Exams on Admission: Ct Angio Chest Pe W Or Wo Contrast  Result Date: 04/26/2016 CLINICAL DATA:  Chest pain and shortness of breath EXAM: CT ANGIOGRAPHY CHEST WITH CONTRAST TECHNIQUE:  Multidetector CT imaging of the chest was performed using the standard protocol during bolus administration of intravenous contrast. Multiplanar CT image reconstructions and MIPs were obtained to evaluate the vascular anatomy. CONTRAST:  100 mL Isovue 370 IV COMPARISON:  Chest radiograph 04/26/2016 better contrast 100 mL Isovue 370 IV FINDINGS: Cardiovascular: There is satisfactory opacification of the pulmonary arteries to the segmental level. There is non opacification of the left lower lobe posterior and medial basal segment pulmonary arteries. Additionally, there is a suspected filling defect within a subsegmental branch of the anterior basal segment left lower lobe pulmonary artery. The remainder of the pulmonary arterial tree is normal. The main pulmonary artery is upper limits normal for size, measuring 2.9 cm at the bifurcation. There is no CT evidence of acute right heart strain. The visualized aorta is normal. The RV:LV ratio measures approximately 0.8. There is a normal 3-vessel arch branching pattern. Heart size is normal, without pericardial effusion. Mediastinum/Nodes: No mediastinal, hilar or axillary lymphadenopathy. The visualized thyroid and thoracic esophageal course are unremarkable. Lungs/Pleura: There are bibasilar opacities, left greater than right. No pneumothorax or pleural effusion. No pulmonary nodules or masses. Upper Abdomen: Contrast bolus timing is not optimized for evaluation of the abdominal organs. Within this limitation, the visualized organs of the upper abdomen are normal. Musculoskeletal: No chest wall abnormality. No acute or significant osseous findings. Bilateral gynecomastia. Review of the MIP images confirms the above findings. IMPRESSION: 1. Pulmonary emboli within the left lower lobe posterior basal and medial basal segmental pulmonary arteries and suspected embolus within a subsegmental branch of the left lower lobe anterior basal segmental artery. 2. No evidence of  right heart strain. 3. Left basilar airspace opacities, likely early pulmonary infarct. These results were called by telephone at the time of interpretation on 04/26/2016 at 5:06 am to Dr. Joseph Berkshire , who verbally acknowledged these results. Electronically Signed   By: Ulyses Jarred M.D.   On: 04/26/2016 05:10   Dg Chest Port 1 View  Result Date: 04/26/2016 CLINICAL DATA:  Acute onset of generalized chest pain. Code ST-elevation myocardial infarction. Severe shortness of breath. Initial encounter.  EXAM: PORTABLE CHEST 1 VIEW COMPARISON:  None. FINDINGS: The lungs are mildly hypoexpanded. Mild bibasilar opacity may reflect atelectasis or possibly minimal interstitial edema. There is no evidence of pleural effusion or pneumothorax. The cardiomediastinal silhouette is borderline normal in size. No acute osseous abnormalities are seen. IMPRESSION: Lungs mildly hypoexpanded. Mild bibasilar opacity may reflect atelectasis or possibly minimal interstitial edema. Electronically Signed   By: Garald Balding M.D.   On: 04/26/2016 00:44    EKG: Independently reviewed.  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  Admitted w SOB and left sided chest pain. Abnormal CTA with LLL PE without  R heart strain and possible early pulmonary infarct. . Tn at 0 . EKG Sinus tachycardia Borderline ST elevation, anterior leads Baseline wander in lead(s) V1 . Placed on Heparin drip. Hypercoagulable panel drawn prior to heparinization. Pain is not resolved  Admit to Step down.  O2 as needed Pharmacy consult to manage Heparin drip; transition to oral anticoagulation in am  Bilateral LE [place] to rule out DVT 2 D Echo Continue to monitor troponins and EKG  Tylenol for fever and pain  Will need to establish PCP f/u once discharge to monitor anticoagulation  issues   Prior opioid dependence, on chronic methadone. UDS negative  Continue methadone here Can follow up as OP at the [facility] clinic once discharged   Tobacco abuse with nicotine withdrawal -  Nicotine patch  -  Counseled cessation   DVT prophylaxis:   Heparin per Pharmacy  Code Status:   Full     Family Communication:  Discussed with patient Disposition Plan: Expect patient to be discharged to home after condition improves Consults called:    None Admission status:Stepdown    [facility] E, PA-C [facility]   04/26/2016, 7:41 AM

## 2016-04-26 NOTE — ED Notes (Signed)
Pt suddenly wake up with mid to left cp 9/10, EKG completed and showed to Dr. Betsey Holiday. Pt states he needs something to be done immediatly Dr. Betsey Holiday notified.

## 2016-04-26 NOTE — Progress Notes (Signed)
ANTICOAGULATION CONSULT NOTE - Follow Up Consult  Pharmacy Consult for Heparin Indication: pulmonary embolus  No Known Allergies  Patient Measurements: Height: 6\' 2"  (188 cm) Weight: 245 lb 2.4 oz (111.2 kg) IBW/kg (Calculated) : 82.2 Heparin Dosing Weight: 105kg  Vital Signs: Temp: 99.6 F (37.6 C) (11/04 2034) Temp Source: Oral (11/04 2034) BP: 137/80 (11/04 2034) Pulse Rate: 78 (11/04 2034)  Labs:  Recent Labs  04/25/16 2349 04/26/16 0755 04/26/16 1028 04/26/16 1253 04/26/16 1254 04/26/16 2019  HGB 12.6*  --   --   --   --   --   HCT 37.1*  --   --   --   --   --   PLT 218  --   --   --   --   --   LABPROT 13.0  --   --   --   --   --   INR 0.98  --   --   --   --   --   HEPARINUNFRC  --   --   --  0.71*  --  0.48  CREATININE 0.92  --   --   --   --   --   TROPONINI  --  <0.03 <0.03  --  <0.03  --     Estimated Creatinine Clearance: 162.8 mL/min (by C-G formula based on SCr of 0.92 mg/dL).   Medications:  Heparin @ 1600 units/hr  Assessment: 25yom continues on heparin for new PE (per CT angio 11/4) . Heparin level is therapeutic at 0.48 after rate decrease earlier today. No bleeding.  Goal of Therapy:  Heparin level 0.3-0.7 units/ml Monitor platelets by anticoagulation protocol: Yes   Plan:  1) Continue heparin at 1600 units/hr 2) Daily heparin level and CBC  Deboraha Sprang 04/26/2016,9:37 PM

## 2016-04-26 NOTE — Progress Notes (Signed)
ANTICOAGULATION CONSULT NOTE - Initial Consult  Pharmacy Consult for heparin Indication: pulmonary embolus  No Known Allergies  Patient Measurements: Height: 6\' 2"  (188 cm) Weight: 245 lb 2.4 oz (111.2 kg) IBW/kg (Calculated) : 82.2 Heparin Dosing Weight: 105kg  Vital Signs: Temp: 98.7 F (37.1 C) (11/04 0940) Temp Source: Oral (11/04 0940) BP: 147/96 (11/04 0940) Pulse Rate: 75 (11/04 0940)  Labs:  Recent Labs  04/25/16 2349 04/26/16 0755 04/26/16 1028 04/26/16 1253  HGB 12.6*  --   --   --   HCT 37.1*  --   --   --   PLT 218  --   --   --   LABPROT 13.0  --   --   --   INR 0.98  --   --   --   HEPARINUNFRC  --   --   --  0.71*  CREATININE 0.92  --   --   --   TROPONINI  --  <0.03 <0.03  --     Estimated Creatinine Clearance: 162.8 mL/min (by C-G formula based on SCr of 0.92 mg/dL).   Medical History: Past Medical History:  Diagnosis Date  . 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 Opioid abuse, in remission     Assessment: 25yo male c/o worsening CP radiating to LUE. CTA reveals PE and pharmacy consulted to dose heparin. Heparin level slightly supratherapeutic at 0.71. CBC stable, platelets within normal limits. No overt signs or symptoms of bleeding noted.   Goal of Therapy:  Heparin level 0.3-0.7 units/ml Monitor platelets by anticoagulation protocol: Yes   Plan:  Reduce heparin gtt to 1600 units/hr  Check heparin level in 6 hours  Monitor heparin levels and CBC daily  Monitor for s/s of bleeding   Argie Ramming, PharmD Pharmacy Resident  Pager [phone] 04/26/16 2:15 PM

## 2016-04-26 NOTE — Progress Notes (Signed)
ANTICOAGULATION CONSULT NOTE - Initial Consult  Pharmacy Consult for heparin Indication: pulmonary embolus  No Known Allergies  Patient Measurements: Height: 6\' 2"  (188 cm) Weight: 255 lb (115.7 kg) IBW/kg (Calculated) : 82.2 Heparin Dosing Weight: 105kg  Vital Signs: Temp: 101 F (38.3 C) (11/03 2359) Temp Source: Oral (11/03 2359) BP: 139/81 (11/04 0615) Pulse Rate: 81 (11/04 0615)  Labs:  Recent Labs  04/25/16 2349  HGB 12.6*  HCT 37.1*  PLT 218  LABPROT 13.0  INR 0.98  CREATININE 0.92    Estimated Creatinine Clearance: 166 mL/min (by C-G formula based on SCr of 0.92 mg/dL).   Medical History: Past Medical History:  Diagnosis Date  . 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 Opioid abuse, in remission      Assessment: 25yo male c/o worsening CP radiating to LUE, worse w/ inspiration, arrived via EMS as code STEMI which was then cancelled, CTA reveals PE, to begin heparin.  Goal of Therapy:  Heparin level 0.3-0.7 units/ml Monitor platelets by anticoagulation protocol: Yes   Plan:  Will give heparin 6000 units IV bolus x1 followed by gtt at 1700 units/hr and monitor heparin levels and CBC.  Wynona Neat, PharmD, BCPS  04/26/2016,6:26 AM

## 2016-04-26 NOTE — ED Notes (Signed)
Patient transported to CT 

## 2016-04-26 NOTE — ED Notes (Signed)
Pt knows he needs a urine sample

## 2016-04-26 NOTE — ED Notes (Signed)
Pt having constant cp, admitting MD notified of need for stepdown order since Telemetry units don't accept active cp.

## 2016-04-26 NOTE — Progress Notes (Signed)
Report received in patient's room using SBAR format, reviewed orders, labs, tests, POC and patient's general condition, assumed care of patient.

## 2016-04-26 NOTE — ED Notes (Signed)
Admitting MD at the bedside.  

## 2016-04-27 ENCOUNTER — Inpatient Hospital Stay (HOSPITAL_COMMUNITY): Payer: Self-pay

## 2016-04-27 DIAGNOSIS — I2699 Other pulmonary embolism without acute cor pulmonale: Secondary | ICD-10-CM

## 2016-04-27 LAB — COMPREHENSIVE METABOLIC PANEL
ALT: 17 U/L (ref 17–63)
AST: 17 U/L (ref 15–41)
Albumin: 2.9 g/dL — ABNORMAL LOW (ref 3.5–5.0)
Alkaline Phosphatase: 41 U/L (ref 38–126)
Anion gap: 7 (ref 5–15)
BUN: 9 mg/dL (ref 6–20)
CHLORIDE: 108 mmol/L (ref 101–111)
CO2: 24 mmol/L (ref 22–32)
Calcium: 8.5 mg/dL — ABNORMAL LOW (ref 8.9–10.3)
Creatinine, Ser: 0.8 mg/dL (ref 0.61–1.24)
Glucose, Bld: 104 mg/dL — ABNORMAL HIGH (ref 65–99)
POTASSIUM: 4.2 mmol/L (ref 3.5–5.1)
Sodium: 139 mmol/L (ref 135–145)
Total Bilirubin: 0.6 mg/dL (ref 0.3–1.2)
Total Protein: 5.5 g/dL — ABNORMAL LOW (ref 6.5–8.1)

## 2016-04-27 LAB — PROTEIN S, TOTAL: Protein S Ag, Total: 80 % (ref 60–150)

## 2016-04-27 LAB — HEPARIN LEVEL (UNFRACTIONATED): Heparin Unfractionated: 0.36 IU/mL (ref 0.30–0.70)

## 2016-04-27 LAB — CBC
HCT: 33.6 % — ABNORMAL LOW (ref 39.0–52.0)
Hemoglobin: 11.3 g/dL — ABNORMAL LOW (ref 13.0–17.0)
MCH: 29.4 pg (ref 26.0–34.0)
MCHC: 33.6 g/dL (ref 30.0–36.0)
MCV: 87.5 fL (ref 78.0–100.0)
PLATELETS: 195 10*3/uL (ref 150–400)
RBC: 3.84 MIL/uL — ABNORMAL LOW (ref 4.22–5.81)
RDW: 13.6 % (ref 11.5–15.5)
WBC: 5.4 10*3/uL (ref 4.0–10.5)

## 2016-04-27 LAB — PROTEIN S ACTIVITY: Protein S Activity: 102 % (ref 63–140)

## 2016-04-27 LAB — PROTEIN C ACTIVITY: Protein C Activity: 96 % (ref 73–180)

## 2016-04-27 LAB — LUPUS ANTICOAGULANT PANEL
DRVVT: 47.9 s — ABNORMAL HIGH (ref 0.0–47.0)
PTT Lupus Anticoagulant: 37.2 s (ref 0.0–51.9)

## 2016-04-27 LAB — DRVVT MIX: DRVVT MIX: 43.6 s (ref 0.0–47.0)

## 2016-04-27 MED ORDER — RIVAROXABAN 20 MG PO TABS: 20.0000 mg | ORAL_TABLET | Freq: Every day | ORAL | Status: DC

## 2016-04-27 MED ORDER — RIVAROXABAN 15 MG PO TABS
15.0000 mg | ORAL_TABLET | Freq: Two times a day (BID) | ORAL | Status: DC
  Administered 2016-04-27 – 2016-04-29 (×5): 15 mg via ORAL
  Filled 2016-04-27 (×5): qty 1

## 2016-04-27 MED ORDER — RIVAROXABAN (XARELTO) EDUCATION KIT FOR DVT/PE PATIENTS
PACK | Freq: Once | Status: AC
  Administered 2016-04-27: 14:00:00
  Filled 2016-04-27: qty 1

## 2016-04-27 NOTE — Progress Notes (Addendum)
Patient ID: Glen Lambert, male   DOB: 1991-02-01, 25 y.o.   MRN: [id number]    PROGRESS NOTE    Kristion Deforge  [name] DOB: 1991-05-26 DOA: 04/25/2016  PCP: Pt has no PCP, Case manager consulted for assistance   Brief Narrative:  25 y.o. male  presenting with 2 day history of  progressive left sided  chest pain, constant and 10/10 in severity, non radiating, worse with deep inspiration. Per EMS, pt was hypertensive (169/111), tachycardic (130 bpm) and hypoxic (low 90s) on their arrival. EMS states pt was placed on 4L O2 with improvement of his SpO2 to 98% en route. Patient is very active, lifts heavy boxes at work, at [place] history of PE in grandfather.  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 - No evidence of right heart strain - pt reports feeling better but has had some hemoptysis and suspect this is related to pulmonary infarction  - pt is currently on Heparin drip and will plan to switch to Xarelto - monitor closely for worsening hemoptysis  - Hg drop noted from 12.6 --> 11.3, monitor closely and check CBC in AM    Left lower extremity  - positive for acute deep vein thrombosis involving the left peroneal veins - Xarelto as noted above     Fever 11/05 - up to 100.11F - no clear signs of an infectious etiology - suspect this is related to the PE and DVT but will certainly monitor closely     Opioid abuse, in remission, 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   Objective: Vitals:   04/26/16 2034 04/26/16 2352 04/27/16 0430 04/27/16 0838  BP: 137/80 137/81 129/79 127/76  Pulse: 78 90 74 86  Resp: 20 (!) 25 (!) 27   Temp: 99.6 F (37.6 C) 100.3 F (37.9 C) 99 F (37.2 C) (!) 100.9 F (38.3 C)  TempSrc: Oral Oral Oral Oral  SpO2: 97% 97% 98% 94%  Weight:   111.6 kg (246 lb 1.6 oz)   Height:        Intake/Output Summary (Last 24 hours) at 04/27/16 0914 Last data filed at 04/27/16 0800  Gross per 24 hour  Intake          2386.03 ml  Output              700 ml  Net          1686.03 ml   Filed Weights   04/25/16 2351 04/26/16 0940 04/27/16 0430  Weight: 115.7 kg (255 lb) 111.2 kg (245 lb 2.4 oz) 111.6 kg (246 lb 1.6 oz)    Examination:  General exam: Appears calm and comfortable  Respiratory system: Clear to auscultation. Respiratory effort normal. Cardiovascular system: S1 & S2 heard, RRR. No JVD, murmurs, rubs, gallops or clicks.  No pedal edema. Gastrointestinal system: Abdomen is nondistended, soft and nontender. No organomegaly or masses felt. Normal bowel sounds heard. Central nervous system: Alert and oriented. No focal neurological deficits. Extremities: Symmetric 5 x 5 power.  Data Reviewed: I have personally reviewed following labs and imaging studies  CBC:  Recent Labs Lab 04/25/16 2349 04/27/16 0318  WBC 7.4 5.4  NEUTROABS 5.0  --   HGB 12.6* 11.3*  HCT 37.1* 33.6*  MCV 86.7 87.5  PLT 218 0000000   Basic Metabolic Panel:  Recent Labs Lab 04/25/16 2349 04/27/16 0318  NA 140 139  K 3.6 4.2  CL 105 108  CO2 28 24  GLUCOSE 119* 104*  BUN 10 9  CREATININE 0.92 0.80  CALCIUM 9.4 8.5*   Liver Function Tests:  Recent Labs Lab 04/25/16 2349 04/27/16 0318  AST 23 17  ALT 21 17  ALKPHOS  50 41  BILITOT 0.5 0.6  PROT 6.5 5.5*  ALBUMIN 3.8 2.9*   Coagulation Profile:  Recent Labs Lab 04/25/16 2349  INR 0.98   Cardiac Enzymes:  Recent Labs Lab 04/26/16 0755 04/26/16 1028 04/26/16 1254  TROPONINI <0.03 <0.03 <0.03   Radiology Studies: Ct Angio Chest Pe W Or Wo Contrast Result Date: 04/26/2016 CLINICAL DATA: Pulmonary emboli within the left lower lobe posterior basal and medial basal segmental pulmonary arteries and suspected embolus within a subsegmental branch of the left lower lobe anterior basal segmental artery. 2. No evidence of right heart strain. 3. Left basilar airspace opacities, likely early pulmonary infarct.   Dg Chest Port 1 View Result Date: 04/26/2016 CLINICAL DATA:  Lungs mildly hypoexpanded. Mild bibasilar opacity may reflect atelectasis or possibly minimal interstitial edema  Scheduled Meds: . methadone  90 mg Oral Daily  . nicotine  21 mg Transdermal Daily   Continuous Infusions: . sodium chloride 75 mL/hr at 04/27/16 0600  . heparin 1,600 Units/hr (04/26/16 2000)     LOS: 1 day   Time spent: 20 minutes   Faye Ramsay, MD [facility] Pager [phone]  If 7PM-7AM, please contact night-coverage [email or web address] Password [email or web address] 04/27/2016, 9:14 AM

## 2016-04-27 NOTE — Progress Notes (Signed)
Updated report received via Jiles Garter RN in patient's room using SBAR format, VS, new orders, meds and events of the day reviewed, assumed care of patient.

## 2016-04-27 NOTE — Plan of Care (Signed)
Problem: Safety: Goal: Ability to remain free from injury will improve Outcome: Completed/Met Date Met: 04/27/16 Patient uses call light appropriately and is able to call via phone for assistance by using the numbers on the white board, all belongings within reach and his call light is on his abdomen. Patient made aware of the bed alarm for all patients after 11pm but his significant other is also in the room and will come to the desk to address patient's needs.

## 2016-04-27 NOTE — Plan of Care (Signed)
Problem: Pain Managment: Goal: General experience of comfort will improve Outcome: Progressing Patient seems to be able to rest and tolerate his chest discomfort from his coughing spells with Naproxyn and GI Cocktail. Patient instructed to do deep breathing exercises after he starts getting relief from the pain meds and to move his arms/legs in the bed to keep from getting muscle atrophy and pneumonia and he is able to communicate his pain needs appropriately, meds seem to be helping him to be comfortable.

## 2016-04-27 NOTE — Progress Notes (Addendum)
ANTICOAGULATION CONSULT NOTE - Follow Up Consult  Pharmacy Consult for Heparin > transition to Xarelto  Indication: pulmonary embolus and DVT   No Known Allergies  Patient Measurements: Height: 6\' 2"  (188 cm) Weight: 246 lb 1.6 oz (111.6 kg) IBW/kg (Calculated) : 82.2 Heparin Dosing Weight: 105kg  Vital Signs: Temp: 99 F (37.2 C) (11/05 0430) Temp Source: Oral (11/05 0430) BP: 129/79 (11/05 0430) Pulse Rate: 74 (11/05 0430)  Labs:  Recent Labs  04/25/16 2349 04/26/16 0755 04/26/16 1028 04/26/16 1253 04/26/16 1254 04/26/16 2019 04/27/16 0318  HGB 12.6*  --   --   --   --   --  11.3*  HCT 37.1*  --   --   --   --   --  33.6*  PLT 218  --   --   --   --   --  195  LABPROT 13.0  --   --   --   --   --   --   INR 0.98  --   --   --   --   --   --   HEPARINUNFRC  --   --   --  0.71*  --  0.48 0.36  CREATININE 0.92  --   --   --   --   --  0.80  TROPONINI  --  <0.03 <0.03  --  <0.03  --   --     Estimated Creatinine Clearance: 187.7 mL/min (by C-G formula based on SCr of 0.8 mg/dL).  Assessment: 25yom continues on heparin for new PE (per CT angio 11/4) and LLE DVT (11/5). Heparin level is therapeutic at 0.36. Pharmacy consulted to dose Xarelto. Spoke with RN who had concerns about hemoptysis. Per Dr. Doyle Askew, she believes this is consequence of PE and will monitor closely. CBC stable, plt wnl.   Goal of Therapy:  Heparin level 0.3-0.7 units/ml Monitor platelets by anticoagulation protocol: Yes   Plan:  D/C heparin Start Xarelto 15 mg BID x 21 days, then 20 mg daily  Monitor daily CBC and for further s/s bleeding   Argie Ramming, PharmD Pharmacy Resident  Pager [phone] 04/27/16 8:40 AM

## 2016-04-27 NOTE — Progress Notes (Signed)
*  Preliminary Results* Bilateral lower extremity venous duplex completed. Right lower extremity is negative for deep vein thrombosis. The left lower extremity is positive for acute deep vein thrombosis involving the left peroneal veins. There is no evidence of Baker's cyst bilaterally.  Preliminary results discussed with Dr. Doyle Askew.  04/27/2016 9:52 AM Maudry Mayhew, BS, RVT, RDCS, RDMS

## 2016-04-28 ENCOUNTER — Inpatient Hospital Stay (HOSPITAL_COMMUNITY): Payer: Self-pay

## 2016-04-28 DIAGNOSIS — R071 Chest pain on breathing: Secondary | ICD-10-CM

## 2016-04-28 DIAGNOSIS — R079 Chest pain, unspecified: Secondary | ICD-10-CM

## 2016-04-28 LAB — BASIC METABOLIC PANEL
Anion gap: 6 (ref 5–15)
BUN: 8 mg/dL (ref 6–20)
CHLORIDE: 105 mmol/L (ref 101–111)
CO2: 27 mmol/L (ref 22–32)
Calcium: 9 mg/dL (ref 8.9–10.3)
Creatinine, Ser: 0.71 mg/dL (ref 0.61–1.24)
GFR calc Af Amer: >60 mL/min (ref >60)
GFR calc non Af Amer: >60 mL/min (ref >60)
Glucose, Bld: 91 mg/dL (ref 65–99)
POTASSIUM: 4.1 mmol/L (ref 3.5–5.1)
SODIUM: 138 mmol/L (ref 135–145)

## 2016-04-28 LAB — HOMOCYSTEINE: Homocysteine: 11.6 umol/L (ref 0.0–15.0)

## 2016-04-28 LAB — ECHOCARDIOGRAM COMPLETE
HEIGHTINCHES: 74 in
WEIGHTICAEL: 4040 [oz_av]

## 2016-04-28 LAB — CBC
HEMATOCRIT: 33.5 % — AB (ref 39.0–52.0)
HEMOGLOBIN: 11.2 g/dL — AB (ref 13.0–17.0)
MCH: 28.9 pg (ref 26.0–34.0)
MCHC: 33.4 g/dL (ref 30.0–36.0)
MCV: 86.6 fL (ref 78.0–100.0)
Platelets: 228 10*3/uL (ref 150–400)
RBC: 3.87 MIL/uL — AB (ref 4.22–5.81)
RDW: 13.1 % (ref 11.5–15.5)
WBC: 4.7 10*3/uL (ref 4.0–10.5)

## 2016-04-28 LAB — BETA-2-GLYCOPROTEIN I ABS, IGG/M/A

## 2016-04-28 LAB — PROTEIN C, TOTAL: PROTEIN C, TOTAL: 69 % (ref 60–150)

## 2016-04-28 MED ORDER — ACETAMINOPHEN 325 MG PO TABS
650.0000 mg | ORAL_TABLET | ORAL | Status: DC | PRN
  Administered 2016-04-29: 650 mg via ORAL
  Filled 2016-04-28: qty 2

## 2016-04-28 NOTE — Discharge Instructions (Addendum)
Pulmonary Embolism A pulmonary embolism (PE) is a sudden blockage or decrease of blood flow in one lung or both lungs. Most blockages come from a blood clot that travels from the legs or the pelvis to the lungs. PE is a dangerous and potentially life-threatening condition if it is not treated right away. CAUSES A pulmonary embolism occurs most commonly when a blood clot travels from one of your veins to your lungs. Rarely, PE is caused by air, fat, amniotic fluid, or part of a tumor traveling through your veins to your lungs. RISK FACTORS A PE is more likely to develop in:  People who smoke.  People who areolder, especially over 86 years of age.  People who are overweight (obese).  People who sit or lie still for a long time, such as during long-distance travel (over 4 hours), bed rest, hospitalization, or during recovery from certain medical conditions like a stroke.  People who do not engage in much physical activity (sedentary lifestyle).  People who have chronic breathing disorders.  People whohave a personal or family history of blood clots or blood clotting disease.  People whohave peripheral vascular disease (PVD), diabetes, or some types of cancer.  People who haveheart disease, especially if the person had a recent heart attack or has congestive heart failure.  People who have neurological diseases that affect the legs (leg paresis).  People who have had a traumatic injury, such as breaking a hip or leg.  People whohave recently had major or lengthy surgery, especially on the hip, knee, or abdomen.  People who have hada central line placed inside a large vein.  People who takemedicines that contain the hormone estrogen. These include birth control pills and hormone replacement therapy.  Pregnancy or during childbirth or the postpartum period. SIGNS AND SYMPTOMS  The symptoms of a PE usually start suddenly and include:  Shortness of breath while active or at  rest.  Coughing or coughing up blood or blood-tinged mucus.  Chest pain that is often worse with deep breaths.  Rapid or irregular heartbeat.  Feeling light-headed or dizzy.  Fainting.  Feelinganxious.  Sweating. There may also be pain and swelling in a leg if that is where the blood clot started. These symptoms may represent a serious problem that is an emergency. Do not wait to see if the symptoms will go away. Get medical help right away. Call your local emergency services (911 in the [place] Do not drive yourself to the hospital. DIAGNOSIS Your health care provider will take a medical history and perform a physical exam. You may also have other tests, including:  Blood tests to assess the clotting properties of your blood, assess oxygen levels in your blood, and find blood clots.  Imaging tests, such as CT, ultrasound, MRI, X-ray, and other tests to see if you have clots anywhere in your body.  An electrocardiogram (ECG) to look for heart strain from blood clots in the lungs. TREATMENT The main goals of PE treatment are:  To stop a blood clot from growing larger.  To stop new blood clots from forming. The type of treatment that you receive depends on many factors, such as the cause of your PE, your risk for bleeding or developing more clots, and other medical conditions that you have. Sometimes, a combination of treatments is necessary. This condition may be treated with:  Medicines, including newer oral blood thinners (anticoagulants), warfarin, low molecular weight heparins, thrombolytics, or heparins.  Wearing compression stockings or using different  types of devices.  Surgery (rare) to remove the blood clot or to place a filter in your abdomen to stop the blood clot from traveling to your lungs. Treatments for a PE are often divided into immediate treatment, long-term treatment (up to 3 months after PE), and extended treatment (more than 3 months after PE). Your  treatment may continue for several months. This is called maintenance therapy, and it is used to prevent the forming of new blood clots. You can work with your health care provider to choose the treatment program that is best for you. What are anticoagulants? Anticoagulants are medicines that treat PEs. They can stop current blood clots from growing and stop new clots from forming. They cannot dissolve existing clots. Your body dissolves clots by itself over time. Anticoagulants are given by mouth, by injection, or through an IV tube. What are thrombolytics? Thrombolytics are clot-dissolving medicines that are used to dissolve a PE. They carry a high risk of bleeding, so they tend to be used only in severe cases or if you have very low blood pressure. HOME CARE INSTRUCTIONS If you are taking a newer oral anticoagulant:  Take the medicine every single day at the same time each day.  Understand what foods and drugs interact with this medicine.  Understand that there are no regular blood tests required when using this medicine.  Understandthe side effects of this medicine, including excessive bruising or bleeding. Ask your health care provider or pharmacist about other possible side effects. If you are taking warfarin:  Understand how to take warfarin and know which foods can affect how warfarin works in Veterinary surgeon.  Understand that it is dangerous to taketoo much or too little warfarin. Too much warfarin increases the risk of bleeding. Too little warfarin continues to allow the risk for blood clots.  Follow your PT and INR blood testing schedule. The PT and INR results allow your health care provider to adjust your dose of warfarin. It is very important that you have your PT and INR tested as often as told by your health care provider.  Avoid major changes in your diet, or tell your health care provider before you change your diet. Arrange a visit with a registered dietitian to answer your  questions. Many foods, especially foods that are high in vitamin K, can interfere with warfarin and affect the PT and INR results. Eat a consistent amount of foods that are high in vitamin K, such as:  Spinach, kale, broccoli, cabbage, collard greens, turnip greens, Brussels sprouts, peas, cauliflower, seaweed, and parsley.  Beef liver and pork liver.  Green tea.  Soybean oil.  Tell your health care provider about any and all medicines, vitamins, and supplements that you take, including aspirin and other over-the-counter anti-inflammatory medicines. Be especially cautious with aspirin and anti-inflammatory medicines. Do not take those before you ask your health care provider if it is safe to do so. This is important because many medicines can interfere with warfarin and affect the PT and INR results.  Do not start or stop taking any over-the-counter or prescription medicine unless your health care provider or pharmacist tells you to do so. If you take warfarin, you will also need to do these things:  Hold pressure over cuts for longer than usual.  Tell your dentist and other health care providers that you are taking warfarin before you have any procedures in which bleeding may occur.  Avoid alcohol or drink very small amounts. Tell your health care  provider if you change your alcohol intake.  Do not use tobacco products, including cigarettes, chewing tobacco, and e-cigarettes. If you need help quitting, ask your health care provider.  Avoid contact sports. General Instructions  Take over-the-counter and prescription medicines only as told by your health care provider. Anticoagulant medicines can have side effects, including easy bruising and difficulty stopping bleeding. If you are prescribed an anticoagulant, you will also need to do these things:  Hold pressure over cuts for longer than usual.  Tell your dentist and other health care providers that you are taking anticoagulants  before you have any procedures in which bleeding may occur.  Avoid contact sports.  Wear a medical alert bracelet or carry a medical alert card that says you have had a PE.  Ask your health care provider how soon you can go back to your normal activities. Stay active to prevent new blood clots from forming.  Make sure to exercise while traveling or when you have been sitting or standing for a long period of time. It is very important to exercise. Exercise your legs by walking or by tightening and relaxing your leg muscles often. Take frequent walks.  Wear compression stockings as told by your health care provider to help prevent more blood clots from forming.  Do not use tobacco products, including cigarettes, chewing tobacco, and e-cigarettes. If you need help quitting, ask your health care provider.  Keep all follow-up appointments with your health care provider. This is important. PREVENTION Take these actions to decrease your risk of developing another PE:  Exercise regularly. For at least 30 minutes every day, engage in:  Activity that involves moving your arms and legs.  Activity that encourages good blood flow through your body by increasing your heart rate.  Exercise your arms and legs every hour during long-distance travel (over 4 hours). Drink plenty of water and avoid drinking alcohol while traveling.  Avoid sitting or lying in bed for long periods of time without moving your legs.  Maintain a weight that is appropriate for your height. Ask your health care provider what weight is healthy for you.  If you are a woman who is over 86 years of age, avoid unnecessary use of medicines that contain estrogen. These include birth control pills.  Do not smoke, especially if you take estrogen medicines. If you need help quitting, ask your health care provider.  If you are at very high risk for PE, wear compression stockings.  If you recently had a PE, have regularly scheduled  ultrasound testing on your legs to check for new blood clots. If you are hospitalized, prevention measures may include:  Early walking after surgery, as soon as your health care provider says that it is safe.  Receiving anticoagulants to prevent blood clots. If you cannot take anticoagulants, other options may be available, such as wearing compression stockings or using different types of devices. SEEK IMMEDIATE MEDICAL CARE IF:  You have new or increased pain, swelling, or redness in an arm or leg.  You have numbness or tingling in an arm or leg.  You have shortness of breath while active or at rest.  You have chest pain.  You have a rapid or irregular heartbeat.  You feel light-headed or dizzy.  You cough up blood.  You notice blood in your vomit, bowel movement, or urine.  You have a fever. These symptoms may represent a serious problem that is an emergency. Do not wait to see if the symptoms  will go away. Get medical help right away. Call your local emergency services (911 in the [place] Do not drive yourself to the hospital.   This information is not intended to replace advice given to you by your health care provider. Make sure you discuss any questions you have with your health care provider.   Document Released: 06/06/2000 Document Revised: 02/28/2015 Document Reviewed: 10/04/2014 Elsevier Interactive Patient Education 2016 [place] on my medicine - XARELTO (rivaroxaban)  This medication education was reviewed with me or my healthcare representative as part of my discharge preparation.  The pharmacist that spoke with me during my hospital stay was:  Wayland Salinas, [place]? Xarelto was prescribed to treat blood clots that may have been found in the veins of your legs (deep vein thrombosis) or in your lungs (pulmonary embolism) and to reduce the risk of them occurring again.  What do you need to know about  Xarelto? The starting dose is one 15 mg tablet taken TWICE daily with food for the FIRST 21 DAYS then on (enter date) 05/18/16  the dose is changed to one 20 mg tablet taken ONCE A DAY with your evening meal.  DO NOT stop taking Xarelto without talking to the health care provider who prescribed the medication.  Refill your prescription for 20 mg tablets before you run out.  After discharge, you should have regular check-up appointments with your healthcare provider that is prescribing your Xarelto.  In the future your dose may need to be changed if your kidney function changes by a significant amount.  What do you do if you miss a dose? If you are taking Xarelto TWICE DAILY and you miss a dose, take it as soon as you remember. You may take two 15 mg tablets (total 30 mg) at the same time then resume your regularly scheduled 15 mg twice daily the next day.  If you are taking Xarelto ONCE DAILY and you miss a dose, take it as soon as you remember on the same day then continue your regularly scheduled once daily regimen the next day. Do not take two doses of Xarelto at the same time.   Important Safety Information Xarelto is a blood thinner medicine that can cause bleeding. You should call your healthcare provider right away if you experience any of the following: ? Bleeding from an injury or your nose that does not stop. ? Unusual colored urine (red or dark brown) or unusual colored stools (red or black). ? Unusual bruising for unknown reasons. ? A serious fall or if you hit your head (even if there is no bleeding).  Some medicines may interact with Xarelto and might increase your risk of bleeding while on Xarelto. To help avoid this, consult your healthcare provider or pharmacist prior to using any new prescription or non-prescription medications, including herbals, vitamins, non-steroidal anti-inflammatory drugs (NSAIDs) and supplements.  This website has more information on Xarelto:  [email or web address]

## 2016-04-28 NOTE — Progress Notes (Addendum)
Patient ID: Glen Lambert, male   DOB: June 18, 1991, 25 y.o.   MRN: Glen Lambert    PROGRESS NOTE    Glen Lambert  Glen Lambert DOB: Jun 07, 1991 DOA: 04/25/2016  PCP: Pt has no PCP, Case manager consulted for assistance   Brief Narrative:  25 y.o. male  presented with 2 day history of progressive left sided chest pain, constant and 10/10 in severity, non radiating, worse with deep inspiration. Per EMS, pt was hypertensive (169/111), tachycardic (130 bpm) and hypoxic (low 90s) on their arrival. EMS states pt was placed on 4L O2 with improvement of his SpO2 to 98% en route. Patient is very active, lifts heavy boxes at work, at [place] history of PE in grandmother. 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 Initially on IV heparin infusion, transitioned to PO Xarelto on 11/5. Pleuritic chest pain and hemoptysis decreasing. Dyspnea improved but still tachypneic with nasal flare on 11/6. Chest x-ray shows bibasal atelectasis. Monitor overnight and possible discharge home 11/7. Needs desaturation evaluation prior to discharge to determine home oxygen requirement. Case management consulted for new PCP appointment and assistance with Xarelto. Recommend outpatient hematology consultation for evaluation for hypercoagulable status.  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 No evidence of right heart strain. 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 - Lower extremity venous Doppler confirmed left peroneal pain acute DVT. - 2-D echo results as below and unremarkable. - Initially on IV heparin infusion, transitioned to PO Xarelto on  11/5. Pleuritic chest pain and hemoptysis decreasing. Dyspnea improved but still tachypneic with nasal flare on 11/6. Chest x-ray shows bibasal atelectasis. Monitor overnight and possible discharge home 11/7. Needs desaturation evaluation prior to discharge to determine home oxygen requirement. Case management consulted for new PCP appointment and assistance with Xarelto. Recommend outpatient hematology consultation for evaluation for hypercoagulable status. - No clear provoking factor for his DVT/PE. No personal history VTE. Patient's grandmother had 2 episodes of PE but apparently provoked by surgery. Patient denies substance abuse/steroid use or recent long distance travel. - Hypoxia resolved but need to reassess with activity. Incentive spirometry. - Due to bleeding risk while on anticoagulation, will discontinue NSAID's and try Tylenol alone for pain. If this does not help, may have to reassess.  Anemia Follow CBCs.     Fever 11/05 - up to 100.62F - no clear signs of an infectious etiology - suspect this is related to the PE, DVT and pulmonary infarct. Monitor off of antibiotics.    Opioid abuse, in remission, Methadone maintenance therapy patient ([facility]) - analgesia can be provided as needed due to acute illness  Tobacco abuse - Cessation counseled. Continue nicotine patch.    DVT prophylaxis: Heparin drip, changed to Xarelto 11/5 Code Status: Full Family Communication: Patient at bedside, fiance at bedside. Patient wanted me to update his grandmother but I was unable to reach her on her phone number and her voicemail has not been set up.  Disposition Plan: Home possibly 11/7 pending improvement in respiratory status.  Consultants:   None  Procedures:   2-D echo 04/28/16: Study Conclusions  - Left ventricle: The cavity size was normal. There was mild focal  basal hypertrophy of the septum. Systolic function was normal.   The estimated ejection fraction was in the range of 60%  to 65%.   Wall motion was normal; there were no regional wall motion   abnormalities. - Mitral valve: There was trivial regurgitation. - Left atrium: The atrium was mildly dilated. - Right ventricle: Poorly visualized.   Lower extremity venous Dopplers 04/27/16: Summary:  - Findings consistent with acute deep vein thrombosis involving the   left peroneal vein. - No obvious evidence of deep vein thrombosis involving the right   lower extremity. - No evidence of Baker&'s cyst on the right or left.  Antimicrobials:   None  Subjective: Dyspnea improved and indicates that breathing is 75% back to normal. Decreased left-sided pleuritic chest pain and hemoptysis-one episode of small dark blood coughed up in the last 24 hours. Denies dizziness or lightheadedness on standing. No other complaints reported.  Objective: Vitals:   04/28/16 0810 04/28/16 1100 04/28/16 1615 04/28/16 1646  BP: 137/72 127/69 137/61 129/63  Pulse: 67   62  Resp:      Temp: 98.9 F (37.2 C)   98.7 F (37.1 C)  TempSrc: Oral   Oral  SpO2: 97% 98%  98%  Weight:      Height:      Respiratory rate: 22/m.  Intake/Output Summary (Last 24 hours) at 04/28/16 1703 Last data filed at 04/28/16 1100  Gross per 24 hour  Intake              360 ml  Output             1125 ml  Net             -765 ml   Filed Weights   04/26/16 0940 04/27/16 0430 04/28/16 0506  Weight: 111.2 kg (245 lb 2.4 oz) 111.6 kg (246 lb 1.6 oz) 114.5 kg (252 lb 8 oz)    Examination:  General exam: Appears calm and comfortable  Respiratory system: Diminished breath sounds in the bases with occasional left basal crackles. Rest clear to auscultation. No pleural rub appreciated. Respiratory effort normal. Cardiovascular system: S1 & S2 heard, RRR. No JVD, murmurs, rubs, gallops or clicks. No pedal edema. Telemetry: Sinus rhythm. Gastrointestinal system: Abdomen is nondistended, soft and nontender. No organomegaly or masses felt. Normal bowel  sounds heard. Central nervous system: Alert and oriented. No focal neurological deficits. Extremities: Symmetric 5 x 5 power.  Data Reviewed: I have personally reviewed following labs and imaging studies  CBC:  Recent Labs Lab 04/25/16 2349 04/27/16 0318 04/28/16 0518  WBC 7.4 5.4 4.7  NEUTROABS 5.0  --   --   HGB 12.6* 11.3* 11.2*  HCT 37.1* 33.6* 33.5*  MCV 86.7 87.5 86.6  PLT 218 195 [id number]   Basic Metabolic Panel:  Recent Labs Lab 04/25/16 2349 04/27/16 0318 04/28/16 0518  NA 140 139 138  K 3.6 4.2 4.1  CL 105 108 105  CO2 28 24 27   GLUCOSE 119* 104* 91  BUN 10 9 8   CREATININE 0.92 0.80 0.71  CALCIUM 9.4 8.5* 9.0   Liver Function Tests:  Recent Labs Lab 04/25/16 2349 04/27/16 0318  AST 23 17  ALT 21 17  ALKPHOS 50 41  BILITOT 0.5 0.6  PROT 6.5 5.5*  ALBUMIN 3.8 2.9*   Coagulation Profile:  Recent Labs Lab 04/25/16 2349  INR 0.98   Cardiac Enzymes:  Recent Labs Lab 04/26/16 0755 04/26/16 1028 04/26/16 1254  TROPONINI <0.03 <0.03 <0.03  Radiology Studies: Ct Angio Chest Pe W Or Wo Contrast Result Date: 04/26/2016 CLINICAL DATA: Pulmonary emboli within the left lower lobe posterior basal and medial basal segmental pulmonary arteries and suspected embolus within a subsegmental branch of the left lower lobe anterior basal segmental artery. 2. No evidence of right heart strain. 3. Left basilar airspace opacities, likely early pulmonary infarct.   Dg Chest Port 1 View Result Date: 04/26/2016 CLINICAL DATA:  Lungs mildly hypoexpanded. Mild bibasilar opacity may reflect atelectasis or possibly minimal interstitial edema  Scheduled Meds: . methadone  90 mg Oral Daily  . nicotine  21 mg Transdermal Daily  . rivaroxaban  15 mg Oral BID   Followed by  . [START ON 05/18/2016] rivaroxaban  20 mg Oral Q supper   Continuous Infusions:    LOS: 2 days   Fahmida Jurich, MD, FACP, FHM. [facility] Pager [phone]  If 7PM-7AM, please contact  night-coverage [email or web address] Password [email or web address] 04/28/2016, 5:15 PM

## 2016-04-28 NOTE — Progress Notes (Signed)
  Echocardiogram 2D Echocardiogram has been performed.  Glen Lambert 04/28/2016, 9:55 AM

## 2016-04-28 NOTE — Care Management Note (Addendum)
Case Management Note  Patient Details  Name: Glen Lambert MRN: [id number] Date of Birth: March 27, 1991  Subjective/Objective:  Pt presented for chest pain. CTA positive for Pulmonary Embolism.  Pt is without insurance and PCP             Action/Plan: CM did provide pt with patient assistance form for Xarelto. MD will need to complete the Rx part. Pt will need to fax to company to see if qualifies for assistance. CM will provide pt with 30 day free card as well. CM called the [facility] to see if any appointments for Hospital F/u and PCP establishment were available. Appointment to be placed on the AVS. No further needs from CM at this time.   Expected Discharge Date:                  Expected Discharge Plan:  Home/Self Care  In-House Referral:  Financial Counselor  Discharge planning Services  CM Consult, [facility], Medication Assistance  Post Acute Care Choice:  NA Choice offered to:  NA  DME Arranged:  N/A DME Agency:  NA  HH Arranged:  NA HH Agency:  NA  Status of Service:  Completed, signed off  If discussed at [place] of Stay Meetings, dates discussed:    Additional Comments:  Bethena Roys, RN 04/28/2016, 10:43 AM

## 2016-04-28 NOTE — Progress Notes (Signed)
Called and spoke witg Dr. Algis Liming, pt HR was 38-42 and BP 119/71. Patient was asymptomatic. MD made some med adjustments. Will continue to monitor.

## 2016-04-29 LAB — CBC
HCT: 34.4 % — ABNORMAL LOW (ref 39.0–52.0)
HEMOGLOBIN: 11.5 g/dL — AB (ref 13.0–17.0)
MCH: 28.5 pg (ref 26.0–34.0)
MCHC: 33.4 g/dL (ref 30.0–36.0)
MCV: 85.1 fL (ref 78.0–100.0)
Platelets: 263 10*3/uL (ref 150–400)
RBC: 4.04 MIL/uL — AB (ref 4.22–5.81)
RDW: 12.6 % (ref 11.5–15.5)
WBC: 4.9 10*3/uL (ref 4.0–10.5)

## 2016-04-29 LAB — CARDIOLIPIN ANTIBODIES, IGG, IGM, IGA: Anticardiolipin IgA: <9 APL U/mL (ref 0–11)

## 2016-04-29 MED ORDER — ACETAMINOPHEN 325 MG PO TABS: 650.0000 mg | ORAL_TABLET | ORAL | Status: DC | PRN

## 2016-04-29 MED ORDER — MAGNESIUM HYDROXIDE 400 MG/5ML PO SUSP
30.0000 mL | Freq: Every day | ORAL | Status: DC | PRN
  Administered 2016-04-29: 30 mL via ORAL

## 2016-04-29 MED ORDER — RIVAROXABAN (XARELTO) VTE STARTER PACK (15 & 20 MG): ORAL_TABLET | ORAL | 0 refills | Status: DC

## 2016-04-29 MED ORDER — NICOTINE 21 MG/24HR TD PT24: 21.0000 mg | MEDICATED_PATCH | Freq: Every day | TRANSDERMAL | 0 refills | Status: DC

## 2016-04-29 NOTE — Discharge Summary (Signed)
Glen Lambert, is a 25 y.o. male  DOB 10-Apr-1991  MRN [id number].  Admission date:  04/25/2016  Admitting Physician  Maren Reamer, MD  Discharge Date:  04/29/2016   Primary MD  No PCP Per Patient  Recommendations for primary care physician for things to follow:  - Please check hypercoagulable workup as an outpatient.   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 Opioid abuse, in remission     Past Surgical History:  Procedure Laterality Date  . ANKLE SURGERY         History of present illness and  Hospital Course:     Kindly see H&P for history of present illness and admission details, please review complete Labs, Consult reports and Test reports for all details in brief  HPI  from the history and physical done on the day of admission 04/26/2016  HPI: Glen Lambert is a 26 y.o. male  presenting with 2 day history of  Progressive left sided  chest pain since 3 am without radiation, constant, worsened with deep inspiration, movement or exertion. Per EMS, pt was hypertensive (169/111), tachycardic (130 bpm) and hypoxic (low 90s) on their arrival. EMS states pt was placed on 4L O2 with improvement of his SpO2 to 98% en route. Pain did not resolve with ASA, GI cocktail or Nitro. Denies any dizziness or falls. No syncope or presyncope. He reports intermittent  episodes of blood tinged sputum, last one prior to admission. Denies any fever or  chills. Denies any nausea, vomiting or abdominal pain. Appetite is normal. Denies any leg swelling but does report Left ankle and lower calf pain . Denies any headaches or vision changes. Denies any seizures No confusion reported. Never seen by a cardiologist  No recent long distance trips. Denies any new stressors. No new meds. Not on hormonal therapy. No new herbal supplements.He smokes 1 ppd of cig. No ETOH or recreational drugs. He is on chronic methadone followed at the [facility] clinic Patient is very active, lifts heavy boxes at work, at [employer] history of PE in grandfather Denies history of sickle cell disease or trait   ED Course:  BP 144/77   Pulse (!) 155   Temp 101 F (38.3 C) (Oral)   Resp (!) 29   Ht 6\' 2"  (1.88 m)   Wt 115.7 kg (255 lb)   SpO2 92%   BMI 32.74 kg/m   Hb 12.6 CMET normal  BNP 11.4 Hypercoagulable panel pending EKG Sinus tachycardia Borderline ST elevation, anterior leads Baseline wander in lead(s) V1  Tn 0 UDS  negative  CT angio chest : Pulmonary emboli within the left lower lobe posterior basal and medial basal segmental pulmonary arteries and suspected embolus within a subsegmental branch of the left lower lobe anterior basal segmental artery.. No evidence of right heart strain. Left basilar airspace opacities, likely early pulmonary infarct. Cardiology was consulted on the phone ruled out STEMI as findings were not consistent with it.   Hospital Course   25 y.o.malepresented with 2 day history of progressive left sided chest pain, constant and 10/10 in severity, non radiating, worse with deep inspiration. Per EMS, pt was hypertensive (169/111), tachycardic (130 bpm) and hypoxic (low 90s) on their arrival. EMS states pt was placed on 4L O2 with improvement of his SpO2 to 98% en route. Patient is very active, lifts heavy boxes at work, at [employer] history of PE in grandmother. 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   Acute respiratory  failure and pleuritic chest pain secondary to acute LLL PE with left basilar pulmonary infarct and left peroneal vein acute DVT - CTA chest 04/26/16: PE within left lower lobe posterior basal and medial basal segmental pulmonary arteries and suspected embolus within the subsegmental branch of the left lower lobe anterior basal segmental artery. No evidence of right heart strain. 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 - Lower extremity venous Doppler confirmed left peroneal pain acute DVT. - 2-D echo results as below and unremarkable. - Initially on IV heparin infusion, transitioned to PO Xarelto on 11/5. Pleuritic chest pain and hemoptysis resolved without discharge, patient completed the hallway with no hypoxia, tachypnea or tachycardia,. - No clear provoking factor for his DVT/PE. No personal history VTE. Patient's grandmother had 2 episodes of PE but apparently provoked by surgery. Patient denies substance abuse/steroid use or recent long distance travel, appointment has been arranged for an outpatient to establish primary care, recommendation is for hypercoagulable workup as an outpatient. - Xarelto started. To discharge, patient was counseled by pharmacy, given card for 30 days Xarelto free supply, reports he works in [employer], and insurance should kick in in 1-2 month.  Anemia Follow CBCs.     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 Continue nicotine patch.    Discharge Condition:  Stable   Follow UP  Follow-up Information    [place]. Go on 04/30/2016.   Why:  @ 1130 am with Dr. Joya Gaskins for Hospital Follow Up and to establish Primary Care Provider. Pharmacy onsite & Medications range in cost from $4.00-$10.00.  Contact information: [place]  [phone]            Discharge Instructions  and  Discharge Medications     Discharge Instructions    Discharge instructions    Complete by:  As directed    Please keep your appointment with: Wellness clinic tomorrow at 04/30/2016   Activity:  Increase activity gradually and slowly   Disposition Home    Diet: Regular diet  On your next visit with your primary care physician please Get Medicines reviewed and adjusted.   Please request your Prim.MD to go over all Hospital Tests and Procedure/Radiological results at the follow up, please get all Hospital records sent to your Prim MD by signing hospital release before you go home.   If you experience worsening  of your admission symptoms, develop shortness of breath, life threatening emergency, suicidal or homicidal thoughts you must seek medical attention immediately by calling 911 or calling your MD immediately  if symptoms less severe.  You Must read complete instructions/literature along with all the possible adverse reactions/side effects for all the Medicines you take and that have been prescribed to you. Take any new Medicines after you have completely understood and accpet all the possible adverse reactions/side effects.   Do not drive, operating heavy machinery, perform activities at heights, swimming or participation in water activities or provide baby sitting services if your were admitted for syncope or siezures until you have seen by Primary MD or a Neurologist and advised to do so again.  Do not drive when taking Pain medications.    Do not take more than prescribed Pain, Sleep and Anxiety Medications  Special Instructions: If you have smoked or chewed Tobacco  in the last 2 yrs please stop smoking, stop any regular Alcohol  and or any Recreational drug use.  Wear Seat belts while driving.   Please note  You were cared for by a hospitalist during your hospital stay. If you have any questions  about your discharge medications or the care you received while you were in the hospital after you are discharged, you can call the unit and asked to speak with the hospitalist on call if the hospitalist that took care of you is not available. Once you are discharged, your primary care physician will handle any further medical issues. Please note that NO REFILLS for any discharge medications will be authorized once you are discharged, as it is imperative that you return to your primary care physician (or establish a relationship with a primary care physician if you do not have one) for your aftercare needs so that they can reassess your need for medications and monitor your lab values.   Increase activity slowly    Complete by:  As directed        Medication List    STOP taking these medications   ibuprofen 800 MG tablet Commonly known as:  ADVIL,MOTRIN   methocarbamol 500 MG tablet Commonly known as:  ROBAXIN   naproxen sodium 220 MG tablet Commonly known as:  ANAPROX   penicillin v potassium 500 MG tablet Commonly known as:  VEETID   sodium-potassium bicarbonate Tbef dissolvable tablet Commonly known as:  ALKA-SELTZER GOLD     TAKE these medications   acetaminophen 325 MG tablet Commonly known as:  TYLENOL Take 2 tablets (650 mg total) by mouth every 4 (four) hours as needed for mild pain, moderate pain, fever or headache.   methadone 10 MG/ML solution Commonly known as:  DOLOPHINE Take 90 mg by mouth daily.   nicotine 21 mg/24hr patch Commonly known as:  NICODERM CQ - dosed in mg/24 hours Place 1 patch (21 mg total) onto the skin daily. Start taking on:  04/30/2016   Rivaroxaban 15 & 20 MG Tbpk Take as directed on package: Start with one 15mg  tablet by mouth twice a day with food. On Day 22, switch to one 20mg  tablet once a day with food.         Diet and Activity recommendation: See Discharge Instructions above   Consults obtained -  None   Major procedures  and Radiology Reports - PLEASE review detailed and final reports for all details, in brief -      Dg Chest 2 View  Result Date: 04/28/2016 CLINICAL DATA:  Chest pain, shortness of breath starting Friday EXAM: CHEST  2 VIEW COMPARISON:  04/26/2016 FINDINGS: Cardiomediastinal silhouette is stable. Bilateral lower lobe hazy atelectasis or infiltrate left greater than right. No pulmonary edema. IMPRESSION: Bilateral lower lobe hazy atelectasis or infiltrate left greater than right. No pulmonary edema. Electronically Signed   By: Lahoma Crocker M.D.   On: 04/28/2016 11:38   Ct Angio Chest Pe W Or Wo Contrast  Result Date: 04/26/2016 CLINICAL DATA:  Chest pain and shortness of breath EXAM: CT ANGIOGRAPHY CHEST WITH CONTRAST TECHNIQUE: Multidetector CT imaging of the chest was performed using the standard protocol during bolus administration of intravenous contrast. Multiplanar CT image reconstructions and MIPs were obtained to evaluate the vascular anatomy. CONTRAST:  100 mL Isovue 370 IV COMPARISON:  Chest radiograph 04/26/2016 better contrast 100 mL Isovue 370 IV FINDINGS: Cardiovascular: There is satisfactory opacification of the pulmonary arteries to the segmental level. There is non opacification of the left lower lobe posterior and medial basal segment pulmonary arteries. Additionally, there is a suspected filling defect within a subsegmental branch of the anterior basal segment left lower lobe pulmonary artery. The remainder of the pulmonary arterial tree is normal. The main pulmonary artery is upper limits normal for size, measuring 2.9 cm at the bifurcation. There is no CT evidence of acute right heart strain. The visualized aorta is normal. The RV:LV ratio measures approximately 0.8. There is a normal 3-vessel arch branching pattern. Heart size is normal, without pericardial effusion. Mediastinum/Nodes: No mediastinal, hilar or axillary lymphadenopathy. The visualized thyroid and thoracic esophageal  course are unremarkable. Lungs/Pleura: There are bibasilar opacities, left greater than right. No pneumothorax or pleural effusion. No pulmonary nodules or masses. Upper Abdomen: Contrast bolus timing is not optimized for evaluation of the abdominal organs. Within this limitation, the visualized organs of the upper abdomen are normal. Musculoskeletal: No chest wall abnormality. No acute or significant osseous findings. Bilateral gynecomastia. Review of the MIP images confirms the above findings. IMPRESSION: 1. Pulmonary emboli within the left lower lobe posterior basal and medial basal segmental pulmonary arteries and suspected embolus within a subsegmental branch of the left lower lobe anterior basal segmental artery. 2. No evidence of right heart strain. 3. Left basilar airspace opacities, likely early pulmonary infarct. These results were called by telephone at the time of interpretation on 04/26/2016 at 5:06 am to Dr. Joseph Berkshire , who verbally acknowledged these results. Electronically Signed   By: Ulyses Jarred M.D.   On: 04/26/2016 05:10   Dg Chest Port 1 View  Result Date: 04/26/2016 CLINICAL DATA:  Acute onset of generalized chest pain. Code ST-elevation myocardial infarction. Severe shortness of breath. Initial encounter. EXAM: PORTABLE CHEST 1 VIEW COMPARISON:  None. FINDINGS: The lungs are mildly hypoexpanded. Mild bibasilar opacity may reflect atelectasis or possibly minimal interstitial edema. There is no evidence of pleural effusion or pneumothorax. The cardiomediastinal silhouette is borderline normal in size. No acute osseous abnormalities are seen. IMPRESSION: Lungs mildly hypoexpanded. Mild bibasilar opacity may reflect atelectasis or possibly minimal interstitial edema. Electronically Signed   By: Garald Balding M.D.   On: 04/26/2016 00:44    Micro Results     No results found for this or any previous visit (from the past 240 hour(s)).     Today   Subjective:   Glen  Lambert today has no headache,no chest abdominal pain,no new weakness tingling or numbness, feels much better wants to go home today.   Objective:   Blood pressure (!) 146/80,  pulse 84, temperature 99.1 F (37.3 C), temperature source Oral, resp. rate 20, height 6\' 2"  (1.88 m), weight 113.5 kg (250 lb 3.2 oz), SpO2 99 %.   Intake/Output Summary (Last 24 hours) at 04/29/16 1138 Last data filed at 04/29/16 0000  Gross per 24 hour  Intake              120 ml  Output              650 ml  Net             -530 ml    Exam Awake Alert, Oriented x 3, No new F.N deficits, Normal affect [place].AT,PERRAL Supple Neck,No JVD, No cervical lymphadenopathy appriciated.  Symmetrical Chest wall movement, Good air movement bilaterally, CTAB RRR,No Gallops,Rubs or new Murmurs, No Parasternal Heave +ve B.Sounds, Abd Soft, Non tender, No organomegaly appriciated, No rebound -guarding or rigidity. No Cyanosis, Clubbing or edema, No new Rash or bruise  Data Review   CBC w Diff: Lab Results  Component Value Date   WBC 4.9 04/29/2016   HGB 11.5 (L) 04/29/2016   HCT 34.4 (L) 04/29/2016   PLT 263 04/29/2016   LYMPHOPCT 25 04/25/2016   MONOPCT 6 04/25/2016   EOSPCT 1 04/25/2016   BASOPCT 0 04/25/2016    CMP: Lab Results  Component Value Date   NA 138 04/28/2016   K 4.1 04/28/2016   CL 105 04/28/2016   CO2 27 04/28/2016   BUN 8 04/28/2016   CREATININE 0.71 04/28/2016   PROT 5.5 (L) 04/27/2016   ALBUMIN 2.9 (L) 04/27/2016   BILITOT 0.6 04/27/2016   ALKPHOS 41 04/27/2016   AST 17 04/27/2016   ALT 17 04/27/2016  .   Total Time in preparing paper work, data evaluation and todays exam - 35 minutes  Tokiko Diefenderfer M.D on 04/29/2016 at 11:38 AM  [facility]   Office  [phone]

## 2016-04-29 NOTE — Progress Notes (Signed)
Updated report received via [facility], reviewed new orders, VS, events of the day and POC, assumed care of patient.

## 2016-04-29 NOTE — Plan of Care (Signed)
Problem: Pain Managment: Goal: General experience of comfort will improve Outcome: Progressing Patient has been taking Naprosyn for chest discomfort with deep inspiration but was D/C'd due to being placed on Xarelto. He now is experiencing less discomfort and now is just using the GI cocktail along with an occasional Xanax for some mild anxiety, will continue to monitor.

## 2016-04-29 NOTE — Progress Notes (Signed)
SATURATION QUALIFICATIONS: (This note is used to comply with regulatory documentation for home oxygen)  Patient Saturations on Room Air at Rest = 99%  Patient Saturations on Room Air while Ambulating =95%   

## 2016-04-30 ENCOUNTER — Encounter: Payer: Self-pay | Admitting: Critical Care Medicine

## 2016-04-30 ENCOUNTER — Other Ambulatory Visit: Payer: Self-pay | Admitting: *Deleted

## 2016-04-30 ENCOUNTER — Ambulatory Visit: Payer: Self-pay | Attending: Critical Care Medicine | Admitting: Critical Care Medicine

## 2016-04-30 DIAGNOSIS — I82442 Acute embolism and thrombosis of left tibial vein: Secondary | ICD-10-CM | POA: Insufficient documentation

## 2016-04-30 DIAGNOSIS — Z86718 Personal history of other venous thrombosis and embolism: Secondary | ICD-10-CM | POA: Insufficient documentation

## 2016-04-30 DIAGNOSIS — Z79899 Other long term (current) drug therapy: Secondary | ICD-10-CM | POA: Insufficient documentation

## 2016-04-30 DIAGNOSIS — Z87891 Personal history of nicotine dependence: Secondary | ICD-10-CM | POA: Insufficient documentation

## 2016-04-30 DIAGNOSIS — Z9889 Other specified postprocedural states: Secondary | ICD-10-CM | POA: Insufficient documentation

## 2016-04-30 DIAGNOSIS — I2699 Other pulmonary embolism without acute cor pulmonale: Secondary | ICD-10-CM | POA: Insufficient documentation

## 2016-04-30 MED ORDER — RIVAROXABAN 20 MG PO TABS: 20.0000 mg | ORAL_TABLET | Freq: Every day | ORAL | 3 refills | Status: DC

## 2016-04-30 NOTE — Progress Notes (Signed)
Subjective:    Patient ID: Glen Lambert, male    DOB: [birth year], 25 y.o.   MRN: [id number]  25 y.o. male  presenting with 2 day history of  Progressive left sided  chest pain since 3 am without radiation, constant, worsened with deep inspiration, movement or exertion. Per EMS, pt was hypertensive (169/111), tachycardic (130 bpm) and hypoxic (low 90s) on their arrival. EMS states pt was placed on 4L O2 with improvement of his SpO2 to 98% en route  Adm 11/3- 11/7  with acute PE   Rx heparin then [place] card issued.  Hx of L ankle surgery,   2006,  Wears a brace on ankle at work. On feet all day.  Here for post hosp f/u  Chest pain is better compared to before.  Feels like a gas bubble on the left side   Shortness of Breath  This is a new problem. The current episode started in the past 7 days. The problem occurs constantly. The problem has been gradually improving. Associated symptoms include chest pain, a fever, hemoptysis, leg swelling, orthopnea and sputum production. Pertinent negatives include no abdominal pain, PND or syncope. Associated symptoms comments: Mucus is bloody and mixed with mucus. . Risk factors include smoking. His past medical history is significant for asthma. There is no history of DVT or PE.   Past Medical History:  Diagnosis Date  . 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 Opioid abuse, in remission      Family History  Problem Relation Age of Onset  . Pulmonary embolism Maternal Grandfather      Social History   Social History  . Marital status: Single    Spouse name: N/A  . Number of children: 1  . Years of education: N/A   Occupational History  . delivery truck driver  [employer]   Social History Main Topics  . Smoking status: Former Smoker    Packs/day: 1.00    Types: Cigarettes    Quit date: 03/25/2016  . Smokeless tobacco: Never Used  . Alcohol use Yes     Comment: Occasional  . Drug use: No     Comment: Hx of Opioid abuse.     Marland Kitchen Sexual activity: Yes   Other Topics Concern  . Not on file   Social History Narrative  . No narrative on file     No Known Allergies   Outpatient Medications Prior to Visit  Medication Sig Dispense Refill  . acetaminophen (TYLENOL) 325 MG tablet Take 2 tablets (650 mg total) by mouth every 4 (four) hours as needed for mild pain, moderate pain, fever or headache.    . methadone (DOLOPHINE) 10 MG/ML solution Take 90 mg by mouth daily.     . Rivaroxaban 15 & 20 MG TBPK Take as directed on package: Start with one 15mg  tablet by mouth twice a day with food. On Day 22, switch to one 20mg  tablet once a day with food. 51 each 0  . nicotine (NICODERM CQ - DOSED IN MG/24 HOURS) 21 mg/24hr patch Place 1 patch (21 mg total) onto the skin daily. (Patient not taking: Reported on 04/30/2016) 28 patch 0   No facility-administered medications prior to visit.       Review of Systems  Constitutional: Positive for fever.  HENT: Negative.   Respiratory: Positive for cough, hemoptysis, sputum production and shortness of breath.   Cardiovascular: Positive for chest pain, orthopnea and leg swelling. Negative for syncope and PND.  Gastrointestinal: Negative.  Negative for abdominal pain.  Genitourinary: Positive for frequency.       Objective:   Physical Exam Vitals:   04/30/16 1158  BP: 121/69  Pulse: 74  Resp: 18  Temp: 98.4 F (36.9 C)  TempSrc: Oral  SpO2: 97%  Weight: 250 lb (113.4 kg)  Height: 6\' 1"  (1.854 m)    Gen: Pleasant, well-nourished, in no distress,  normal affect  ENT: No lesions,  mouth clear,  oropharynx clear, no postnasal drip  Neck: No JVD, no TMG, no carotid bruits  Lungs: No use of accessory muscles, no dullness to percussion, clear without rales or rhonchi  Cardiovascular: RRR, heart sounds normal, no murmur or gallops, no peripheral edema  Abdomen: soft and NT, no HSM,  BS normal  Musculoskeletal: No deformities, no cyanosis or clubbing  Neuro: alert,  non focal  Skin: Warm, no lesions or rashes   Prot C S lupus anticardio lipin all normal 11/4 Lower extremity venous Doppler confirmed left peroneal pain acute DVT. - 2-D echo results as below and unremarkable. - Initially on IV heparin infusion, transitioned to PO Xareltoon 11/5. Pleuritic chest pain and hemoptysis resolved without discharge, patient completed the hallway with no hypoxia 11/4 CT angio chest : Pulmonary emboli within the left lower lobe posterior basal and medial basal segmental pulmonary arteries and suspected embolus within a subsegmental branch of the left lower lobe anterior basal segmental artery.. No evidence of right heart strain. Left basilar airspace opacities, likely early pulmonary infarct.    Assessment & Plan:  I personally reviewed all images and lab data in the [facility] system as well as any outside material available during this office visit and agree with the  radiology impressions.   Acute pulmonary embolism (HCC) Acute PE and DVT Provoked Neg initial hypercoag workup Plan To stop smoking  xarelto finish 15mg  bid x 21days then start 20mg  daily for at least 3 months F/u hypercoag w/u Return see me Dec 20th  DVT of lower extremity (deep venous thrombosis) ([place]) Acute peroneal VTE  See PE assessment    Karlon was seen today for hospitalization follow-up.  Diagnoses and all orders for this visit:  Other acute pulmonary embolism without acute cor pulmonale (HCC)  Acute deep vein thrombosis (DVT) of tibial vein of left lower extremity (HCC)  Other orders -     Discontinue: rivaroxaban (XARELTO) 20 MG TABS tablet; Take 1 tablet (20 mg total) by mouth daily with supper. Start when 15mg  dosage is completed

## 2016-04-30 NOTE — Progress Notes (Signed)
Patient is here for HFU PE  Patient complains of intermittent left side rib pain being present. Pain is scaled at a 6.  Patient has taken medication today. Patient has eaten today.  Patient declined the flu vaccine today.

## 2016-04-30 NOTE — Assessment & Plan Note (Signed)
Acute PE and DVT Provoked Neg initial hypercoag workup Plan To stop smoking  xarelto finish 15mg  bid x 21days then start 20mg  daily for at least 3 months F/u hypercoag w/u Return see me Dec 20th

## 2016-04-30 NOTE — Telephone Encounter (Signed)
PRINTED FOR PASS PROGRAM 

## 2016-04-30 NOTE — Patient Instructions (Signed)
Finish xarelto 15mg  twice daily for three full weeks then start 20mg  dosage of xarelto one daily Return Dec 20 for follow up

## 2016-04-30 NOTE — Assessment & Plan Note (Signed)
Acute peroneal VTE  See PE assessment

## 2016-05-01 LAB — FACTOR 5 LEIDEN

## 2016-05-01 LAB — PROTHROMBIN GENE MUTATION

## 2016-05-06 ENCOUNTER — Telehealth: Payer: Self-pay | Admitting: Internal Medicine

## 2016-05-06 MED FILL — XARELTO 20 MG TABLET: 20 | 30 days supply | Qty: 30 | Fill #0

## 2016-05-06 NOTE — Telephone Encounter (Signed)
I am fine with a letter to extend his time off work throught end of this week

## 2016-05-06 NOTE — Telephone Encounter (Signed)
Patient called the office to speak with nurse regarding an excuse letter. Pt didn't go to work today (as previous note stated) because he's not feeling good. He will be going in tomorrow. Pt is requesting to have a new letter written stating that he will go back in tomorrow.   Thank you.

## 2016-05-06 NOTE — Telephone Encounter (Signed)
Patient is stating he still does not feel well today. Patient is asking if a new letter can be generated to accommodate him being out today? Patient was off work Thursday, Friday, Saturday, Sunday, Monday (which our letter covers) and now today he is asking for today?

## 2016-05-07 NOTE — Telephone Encounter (Signed)
Thank you! I will call to let him know!

## 2016-05-17 ENCOUNTER — Emergency Department (HOSPITAL_COMMUNITY)
Admission: EM | Admit: 2016-05-17 | Discharge: 2016-05-18 | Disposition: A | Discharge status: Home / Self Care | Payer: Self-pay | Attending: Emergency Medicine | Admitting: Emergency Medicine

## 2016-05-17 ENCOUNTER — Other Ambulatory Visit: Payer: Self-pay

## 2016-05-17 DIAGNOSIS — Z87891 Personal history of nicotine dependence: Secondary | ICD-10-CM | POA: Insufficient documentation

## 2016-05-17 DIAGNOSIS — Z7901 Long term (current) use of anticoagulants: Secondary | ICD-10-CM | POA: Insufficient documentation

## 2016-05-17 DIAGNOSIS — I2699 Other pulmonary embolism without acute cor pulmonale: Secondary | ICD-10-CM | POA: Insufficient documentation

## 2016-05-17 HISTORY — DX: Other pulmonary embolism without acute cor pulmonale: I26.99

## 2016-05-17 NOTE — ED Triage Notes (Signed)
Pt here for chest pain and sob, onset 3 hours ago, pt admitted 1 week ago with PE, sts today went to ed in [place] but left ama after 30 mins due to delay in care

## 2016-05-18 ENCOUNTER — Emergency Department (HOSPITAL_COMMUNITY): Payer: Self-pay

## 2016-05-18 ENCOUNTER — Encounter (HOSPITAL_COMMUNITY): Payer: Self-pay

## 2016-05-18 LAB — I-STAT TROPONIN, ED: Troponin i, poc: 0 ng/mL (ref 0.00–0.08)

## 2016-05-18 LAB — BASIC METABOLIC PANEL
Anion gap: 9 (ref 5–15)
BUN: 10 mg/dL (ref 6–20)
CHLORIDE: 104 mmol/L (ref 101–111)
CO2: 23 mmol/L (ref 22–32)
Calcium: 9.3 mg/dL (ref 8.9–10.3)
Creatinine, Ser: 0.77 mg/dL (ref 0.61–1.24)
GFR calc non Af Amer: >60 mL/min (ref >60)
Glucose, Bld: 102 mg/dL — ABNORMAL HIGH (ref 65–99)
POTASSIUM: 4 mmol/L (ref 3.5–5.1)
SODIUM: 136 mmol/L (ref 135–145)

## 2016-05-18 LAB — CBC
HEMATOCRIT: 34.4 % — AB (ref 39.0–52.0)
Hemoglobin: 11.5 g/dL — ABNORMAL LOW (ref 13.0–17.0)
MCH: 28.2 pg (ref 26.0–34.0)
MCHC: 33.4 g/dL (ref 30.0–36.0)
MCV: 84.3 fL (ref 78.0–100.0)
Platelets: 277 10*3/uL (ref 150–400)
RBC: 4.08 MIL/uL — AB (ref 4.22–5.81)
RDW: 13.6 % (ref 11.5–15.5)
WBC: 7.3 10*3/uL (ref 4.0–10.5)

## 2016-05-18 LAB — PROTIME-INR
INR: 1.91
PROTHROMBIN TIME: 22.1 s — AB (ref 11.4–15.2)

## 2016-05-18 MED ORDER — OXYCODONE-ACETAMINOPHEN 5-325 MG PO TABS
1.0000 | ORAL_TABLET | Freq: Once | ORAL | Status: AC
  Administered 2016-05-18: 1 via ORAL
  Filled 2016-05-18: qty 1

## 2016-05-18 MED ORDER — MORPHINE SULFATE (PF) 4 MG/ML IV SOLN
4.0000 mg | Freq: Once | INTRAVENOUS | Status: AC
  Administered 2016-05-18: 4 mg via INTRAVENOUS
  Filled 2016-05-18: qty 1

## 2016-05-18 MED ORDER — IOPAMIDOL (ISOVUE-370) INJECTION 76%
INTRAVENOUS | Status: AC
  Administered 2016-05-18: 100 mL
  Filled 2016-05-18: qty 100

## 2016-05-18 MED ORDER — ONDANSETRON HCL 4 MG/2ML IJ SOLN
4.0000 mg | Freq: Once | INTRAMUSCULAR | Status: AC
  Administered 2016-05-18: 4 mg via INTRAVENOUS
  Filled 2016-05-18: qty 2

## 2016-05-18 MED ORDER — OXYCODONE-ACETAMINOPHEN 5-325 MG PO TABS: 1.0000 | ORAL_TABLET | Freq: Four times a day (QID) | ORAL | 0 refills | Status: DC | PRN

## 2016-05-18 NOTE — Discharge Instructions (Signed)
[place]. Go on 04/30/2016.   Why:  @ 1130 am with Dr. Joya Gaskins for Hospital Follow Up and to establish Primary Care Provider. Pharmacy onsite & Medications range in cost from $4.00-$10.00.  Contact information: [address] [place] [phone]

## 2016-05-18 NOTE — ED Notes (Signed)
Denies issue discharged home understands discharge instructions

## 2016-05-18 NOTE — ED Notes (Signed)
Pt to CT

## 2016-05-18 NOTE — ED Notes (Signed)
Idamae Lusher Grandmother [id number], NOK

## 2016-05-18 NOTE — ED Provider Notes (Signed)
[facility] DEPT Provider Note   CSN: [id number] Arrival date & time: 05/17/16  2353     History   Chief Complaint Chief Complaint  Patient presents with  . Chest Pain    HPI Glen Lambert is a 25 y.o. male.  HPI   Patient presents to the emergency department with complaints of chest pain. Earlier this month he was admitted for pulmonary infarct, pulmonary embolism with DVT of his lower extremity. He denies using IV or illicit drugs but does use methadone. He states that since he has been out of the hospital he has been feeling okay but since Thanksgiving he has been more active and has developed chest pain to the left side of his chest with some mild shortness of breath. He is not hypoxic on arrival. Is concerned that he may have another pulmonary embolism or that things are getting worse, therefore he came to the emergency department for evaluation. Denies having fever, cough, back pain, weakness, altered mental status, nausea, vomiting, diarrhea, lower extremity  swelling  Past Medical History:  Diagnosis Date  . 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 Opioid abuse, in remission   . Pulmonary embolism [facility])     Patient Active Problem List   Diagnosis Date Noted  . DVT of lower extremity (deep venous thrombosis) ([place]) 04/30/2016  . PE (pulmonary thromboembolism) ([place]) 04/26/2016  . Opioid abuse, in remission 04/26/2016  . 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 Tobacco abuse 04/26/2016  . Chest pain 04/26/2016  . Acute pulmonary embolism ([place]) 04/26/2016    Past Surgical History:  Procedure Laterality Date  . ANKLE SURGERY    . JOINT REPLACEMENT       Home Medications    Prior to Admission medications   Medication Sig Start Date End Date Taking? Authorizing Provider  acetaminophen (TYLENOL) 325 MG tablet Take 2 tablets (650 mg total) by mouth every 4 (four) hours as needed for mild pain, moderate pain, fever or headache. 04/29/16  Yes  Albertine Patricia, MD  methadone (DOLOPHINE) 10 MG/ML solution Take 100 mg by mouth daily.    Yes Historical Provider, MD  Rivaroxaban 15 & 20 MG TBPK Take as directed on package: Start with one 15mg  tablet by mouth twice a day with food. On Day 22, switch to one 20mg  tablet once a day with food. 04/29/16  Yes Albertine Patricia, MD  oxyCODONE-acetaminophen (PERCOCET/ROXICET) 5-325 MG tablet Take 1 tablet by mouth every 6 (six) hours as needed for severe pain. 05/18/16   Tye Vigo Carlota Raspberry, PA-C  rivaroxaban (XARELTO) 20 MG TABS tablet Take 1 tablet (20 mg total) by mouth daily with supper. Patient not taking: Reported on 05/18/2016 04/30/16   Elsie Stain, MD    Family History Family History  Problem Relation Age of Onset  . Pulmonary embolism Maternal Grandfather     Social History Social History  Substance Use Topics  . Smoking status: Former Smoker    Packs/day: 1.00    Types: Cigarettes    Quit date: 03/25/2016  . Smokeless tobacco: Never Used  . Alcohol use Yes     Comment: Occasional     Allergies   Patient has no known allergies.   Review of Systems Review of Systems  Review of Systems All other systems negative except as documented in the HPI. All pertinent positives and negatives as reviewed in the HPI.  Physical Exam Updated Vital Signs BP 124/69 (BP Location: Right Arm)   Pulse 66  Temp 99.2 F (37.3 C) (Oral)   Resp 16   Ht 6\' 2"  (1.88 m)   Wt 113.4 kg   SpO2 99%   BMI 32.10 kg/m   Physical Exam  Constitutional: He is oriented to person, place, and time. He appears well-developed and well-nourished. No distress.  HENT:  Head: Normocephalic and atraumatic.  Right Ear: Tympanic membrane and ear canal normal.  Left Ear: Tympanic membrane and ear canal normal.  Nose: Nose normal.  Mouth/Throat: Uvula is midline, oropharynx is clear and moist and mucous membranes are normal.  Eyes: EOM are normal. Pupils are equal, round, and reactive to light.  Neck:  Normal range of motion. Neck supple.  Cardiovascular: Normal rate and regular rhythm.   Pulmonary/Chest: Effort normal and breath sounds normal.  Abdominal: Soft.  No signs of abdominal distention  Musculoskeletal: Normal range of motion.  No LE swelling  Neurological: He is alert and oriented to person, place, and time.  Acting at baseline  Skin: Skin is warm and dry. No rash noted.  Nursing note and vitals reviewed.    ED Treatments / Results  Labs (all labs ordered are listed, but only abnormal results are displayed) Labs Reviewed  BASIC METABOLIC PANEL - Abnormal; Notable for the following:       Result Value   Glucose, Bld 102 (*)    All other components within normal limits  CBC - Abnormal; Notable for the following:    RBC 4.08 (*)    Hemoglobin 11.5 (*)    HCT 34.4 (*)    All other components within normal limits  PROTIME-INR - Abnormal; Notable for the following:    Prothrombin Time 22.1 (*)    All other components within normal limits  I-STAT TROPOININ, ED    EKG  EKG Interpretation  Date/Time:  Saturday May 17 2016 23:45:57 EST Ventricular Rate:  79 PR Interval:  186 QRS Duration: 90 QT Interval:  364 QTC Calculation: 417 R Axis:   2 Text Interpretation:  Normal sinus rhythm Normal ECG When compared with ECG of 04/26/2016, No significant change was found Confirmed by [facility]  MD, DAVID ([id number]) on 05/18/2016 12:00:46 AM       Radiology Dg Chest 2 View  Result Date: 05/18/2016 CLINICAL DATA:  Chest pain for couple of hours. History of blood clot on the left side 1 week ago. History of hypertension. Ex-smoker. EXAM: CHEST  2 VIEW COMPARISON:  04/28/2016 FINDINGS: Normal heart size and pulmonary vascularity. Shallow inspiration with linear atelectasis in the lung bases. Previous effusions and atelectasis have decreased since the prior study. No blunting of costophrenic angles. No pneumothorax. No focal consolidation or airspace disease. Mediastinal  contours appear intact. IMPRESSION: Shallow inspiration with linear atelectasis, improving since previous study. Previous pleural effusions have resolved. Electronically Signed   By: Lucienne Capers M.D.   On: 05/18/2016 01:15   Ct Angio Chest Pe W Or Wo Contrast  Result Date: 05/18/2016 CLINICAL DATA:  25 y/o M; pulmonary embolus with worsening chest pain and shortness of breath. EXAM: CT ANGIOGRAPHY CHEST WITH CONTRAST TECHNIQUE: Multidetector CT imaging of the chest was performed using the standard protocol during bolus administration of intravenous contrast. Multiplanar CT image reconstructions and MIPs were obtained to evaluate the vascular anatomy. CONTRAST:  100 cc Isovue 370. COMPARISON:  04/26/2016 CT chest. FINDINGS: Cardiovascular: Satisfactory opacification of pulmonary arteries to the segmental level. Pulmonary embolus is seen within the left lower lobe posterior medial basal segments. No new pulmonary embolus is  identified. No evidence for right heart strain. Mediastinum/Nodes: No enlarged mediastinal, hilar, or axillary lymph nodes. Thyroid gland, trachea, and esophagus demonstrate no significant findings. Lungs/Pleura: Consolidation and ground-glass go glass opacity within the posterior left lower lobe probably represents evolving pulmonary infarct. There is a new small left pleural effusion. No new airspace disease. Upper Abdomen: No acute abnormality. Musculoskeletal: Gynecomastia.  Bones are unremarkable. Review of the MIP images confirms the above findings. IMPRESSION: 1. Stable pulmonary embolus within the left lower lobe with medial and posterior basal segments. No evidence for new pulmonary embolus. 2. Left posterior lower lobe consolidation and ground-glass opacity is compatible with a pulmonary infarct unchanged in distribution from prior CT. New small left effusion. No new airspace disease. Electronically Signed   By: Kristine Garbe M.D.   On: 05/18/2016 03:37    Stable  PE, previous infarct noted. No acute findings, changes or airspace disease. The patients pain is manageable in the ED.  Neg trop, PT/INR wnl (pt on Xarelto and complaint). Pt has follow-up appointment with [facility] and [facility].  I discussed results, diagnoses and plan with Rilyn Civello. They voice there understanding and questions were answered. We discussed follow-up recommendations and return precautions.  Procedures Procedures (including critical care time)  Initial Impression / Assessment and Plan / ED Course  I have reviewed the triage vital signs and the nursing notes.  Pertinent labs & imaging results that were available during my care of the patient were reviewed by me and considered in my medical decision making (see chart for details).  Clinical Course     Final Clinical Impressions(s) / ED Diagnoses   Final diagnoses:  Other pulmonary embolism without acute cor pulmonale, unspecified chronicity (HCC)    New Prescriptions New Prescriptions   OXYCODONE-ACETAMINOPHEN (PERCOCET/ROXICET) 5-325 MG TABLET    Take 1 tablet by mouth every 6 (six) hours as needed for severe pain.     Delos Haring, PA-C [id number]    Delora Fuel, MD [id number] [phone]

## 2016-06-23 DIAGNOSIS — I2699 Other pulmonary embolism without acute cor pulmonale: Secondary | ICD-10-CM

## 2016-06-23 HISTORY — DX: Other pulmonary embolism without acute cor pulmonale: I26.99

## 2016-06-24 ENCOUNTER — Encounter: Payer: Self-pay | Admitting: Family Medicine

## 2016-06-24 ENCOUNTER — Encounter (INDEPENDENT_AMBULATORY_CARE_PROVIDER_SITE_OTHER): Payer: Self-pay

## 2016-06-24 ENCOUNTER — Ambulatory Visit: Payer: Self-pay | Attending: Family Medicine | Admitting: Family Medicine

## 2016-06-24 VITALS — BP 124/82 | HR 73 | Temp 98.8°F | Ht 74.0 in | Wt 254.6 lb

## 2016-06-24 DIAGNOSIS — I2699 Other pulmonary embolism without acute cor pulmonale: Secondary | ICD-10-CM | POA: Insufficient documentation

## 2016-06-24 DIAGNOSIS — Z720 Tobacco use: Secondary | ICD-10-CM

## 2016-06-24 DIAGNOSIS — Z5189 Encounter for other specified aftercare: Secondary | ICD-10-CM | POA: Insufficient documentation

## 2016-06-24 DIAGNOSIS — I82442 Acute embolism and thrombosis of left tibial vein: Secondary | ICD-10-CM | POA: Insufficient documentation

## 2016-06-24 DIAGNOSIS — Z7901 Long term (current) use of anticoagulants: Secondary | ICD-10-CM | POA: Insufficient documentation

## 2016-06-24 MED ORDER — RIVAROXABAN 20 MG PO TABS: 20.0000 mg | ORAL_TABLET | Freq: Every day | ORAL | 0 refills | Status: DC

## 2016-06-24 MED ORDER — NICOTINE 14 MG/24HR TD PT24: 14.0000 mg | MEDICATED_PATCH | Freq: Every day | TRANSDERMAL | 1 refills | Status: DC

## 2016-06-24 MED FILL — XARELTO 20 MG TABLET: 20 | 30 days supply | Qty: 30 | Fill #0

## 2016-06-24 NOTE — Progress Notes (Signed)
Subjective:  Patient ID: Glen Lambert, male    DOB: [birth year]  Age: 26 y.o. MRN: [id number]  CC: Hospitalization Follow-up and pulmonary embolism   HPI Glen Lambert is a 26 year old male with a history of tobacco abuse, unprovoked left peroneal DVT and acute PE diagnosed during a hospitalization in 04/2016 who presents today for a follow-up visit. He has been on Xarelto ,20 mg daily and is requesting refill.  He endorses a family history of PE in his grandmom but this was post surgery. He occasionally has chest pains when he does heavy lifting and stretching; he works at [employer] and does not want to take anything for this.  Past Medical History:  Diagnosis Date  . 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 Opioid abuse, in remission   . Pulmonary embolism [facility])     Past Surgical History:  Procedure Laterality Date  . ANKLE SURGERY    . JOINT REPLACEMENT      No Known Allergies   Outpatient Medications Prior to Visit  Medication Sig Dispense Refill  . acetaminophen (TYLENOL) 325 MG tablet Take 2 tablets (650 mg total) by mouth every 4 (four) hours as needed for mild pain, moderate pain, fever or headache.    . methadone (DOLOPHINE) 10 MG/ML solution Take 100 mg by mouth daily.     . rivaroxaban (XARELTO) 20 MG TABS tablet Take 1 tablet (20 mg total) by mouth daily with supper. 90 tablet 3  . oxyCODONE-acetaminophen (PERCOCET/ROXICET) 5-325 MG tablet Take 1 tablet by mouth every 6 (six) hours as needed for severe pain. (Patient not taking: Reported on 06/24/2016) 15 tablet 0  . Rivaroxaban 15 & 20 MG TBPK Take as directed on package: Start with one 15mg  tablet by mouth twice a day with food. On Day 22, switch to one 20mg  tablet once a day with food. 51 each 0   No facility-administered medications prior to visit.     ROS Review of Systems  Constitutional: Negative for activity change and appetite change.  HENT: Negative for sinus pressure and sore throat.   Respiratory:  Negative for chest tightness, shortness of breath and wheezing.   Cardiovascular: Negative for chest pain and palpitations.  Gastrointestinal: Negative for abdominal distention, abdominal pain and constipation.  Genitourinary: Negative.   Musculoskeletal: Negative.   Psychiatric/Behavioral: Negative for behavioral problems and dysphoric mood.    Objective:  BP 124/82 (BP Location: Right Arm, Patient Position: Sitting, Cuff Size: Large)   Pulse 73   Temp 98.8 F (37.1 C) (Oral)   Ht 6\' 2"  (1.88 m)   Wt 254 lb 9.6 oz (115.5 kg)   SpO2 99%   BMI 32.69 kg/m   BP/Weight 06/24/2016 05/18/2016 [id number]  Systolic BP [id number] -  Diastolic BP 82 76 -  Wt. (Lbs) 254.6 - 250  BMI 32.69 - 32.1      Physical Exam  Constitutional: He is oriented to person, place, and time. He appears well-developed and well-nourished.  Cardiovascular: Normal rate, normal heart sounds and intact distal pulses.   No murmur heard. Pulmonary/Chest: Effort normal and breath sounds normal. He has no wheezes. He has no rales. He exhibits no tenderness.  Abdominal: Soft. Bowel sounds are normal. He exhibits no distension and no mass. There is no tenderness.  Musculoskeletal: Normal range of motion.  Neurological: He is alert and oriented to person, place, and time.     Assessment & Plan:   1. PE (pulmonary thromboembolism) ([place]) Unprovoked Continue anticoagulation until 07/30/16  We'll send a hypercoagulable labs 2 weeks after completion of anticoagulation - rivaroxaban (XARELTO) 20 MG TABS tablet; Take 1 tablet (20 mg total) by mouth daily with supper.  Dispense: 30 tablet; Refill: 0  2. Acute deep vein thrombosis (DVT) of tibial vein of left lower extremity (HCC) - rivaroxaban (XARELTO) 20 MG TABS tablet; Take 1 tablet (20 mg total) by mouth daily with supper.  Dispense: 30 tablet; Refill: 0  3. Tobacco abuse Smoking cessation support: smoking cessation hotline: 1-800-QUIT-NOW.  Smoking cessation classes  are available through [facility]. Call [phone] or visit our website at [email or web address]  Spent 3 minutes counseling on smoking cessation and patient is working on quitting. - nicotine (NICODERM CQ) 14 mg/24hr patch; Place 1 patch (14 mg total) onto the skin daily.  Dispense: 28 patch; Refill: 1   Meds ordered this encounter  Medications  . rivaroxaban (XARELTO) 20 MG TABS tablet    Sig: Take 1 tablet (20 mg total) by mouth daily with supper.    Dispense:  30 tablet    Refill:  0    He needs only one month supply: anticoagulation to be completed in one month.  . nicotine (NICODERM CQ) 14 mg/24hr patch    Sig: Place 1 patch (14 mg total) onto the skin daily.    Dispense:  28 patch    Refill:  1    Follow-up: Return in about 6 weeks (around 08/05/2016) for Follow-up on pulmonary embolism.   Arnoldo Morale MD

## 2016-06-24 NOTE — Progress Notes (Signed)
Requesting nicoderm patch Has 4 days of the xarelto left

## 2016-06-24 NOTE — Patient Instructions (Signed)
Pulmonary Embolism °A pulmonary embolism (PE) is a sudden blockage or decrease of blood flow in one lung or both lungs. Most blockages come from a blood clot that travels from the legs or the pelvis to the lungs. PE is a dangerous and potentially life-threatening condition if it is not treated right away. °What are the causes? °A pulmonary embolism occurs most commonly when a blood clot travels from one of your veins to your lungs. Rarely, PE is caused by air, fat, amniotic fluid, or part of a tumor traveling through your veins to your lungs. °What increases the risk? °A PE is more likely to develop in: °· People who smoke. °· People who are older, especially over 60 years of age. °· People who are overweight (obese). °· People who sit or lie still for a long time, such as during long-distance travel (over 4 hours), bed rest, hospitalization, or during recovery from certain medical conditions like a stroke. °· People who do not engage in much physical activity (sedentary lifestyle). °· People who have chronic breathing disorders. °· People who have a personal or family history of blood clots or blood clotting disease. °· People who have peripheral vascular disease (PVD), diabetes, or some types of cancer. °· People who have heart disease, especially if the person had a recent heart attack or has congestive heart failure. °· People who have neurological diseases that affect the legs (leg paresis). °· People who have had a traumatic injury, such as breaking a hip or leg. °· People who have recently had major or lengthy surgery, especially on the hip, knee, or abdomen. °· People who have had a central line placed inside a large vein. °· People who take medicines that contain the hormone estrogen. These include birth control pills and hormone replacement therapy. °· Pregnancy or during childbirth or the postpartum period. ° °What are the signs or symptoms? °The symptoms of a PE usually start suddenly and  include: °· Shortness of breath while active or at rest. °· Coughing or coughing up blood or blood-tinged mucus. °· Chest pain that is often worse with deep breaths. °· Rapid or irregular heartbeat. °· Feeling light-headed or dizzy. °· Fainting. °· Feeling anxious. °· Sweating. ° °There may also be pain and swelling in a leg if that is where the blood clot started. °These symptoms may represent a serious problem that is an emergency. Do not wait to see if the symptoms will go away. Get medical help right away. Call your local emergency services (911 in the [place] Do not drive yourself to the hospital. °How is this diagnosed? °Your health care provider will take a medical history and perform a physical exam. You may also have other tests, including: °· Blood tests to assess the clotting properties of your blood, assess oxygen levels in your blood, and find blood clots. °· Imaging tests, such as CT, ultrasound, MRI, X-ray, and other tests to see if you have clots anywhere in your body. °· An electrocardiogram (ECG) to look for heart strain from blood clots in the lungs. ° °How is this treated? °The main goals of PE treatment are: °· To stop a blood clot from growing larger. °· To stop new blood clots from forming. ° °The type of treatment that you receive depends on many factors, such as the cause of your PE, your risk for bleeding or developing more clots, and other medical conditions that you have. Sometimes, a combination of treatments is necessary. °This condition may be treated with: °· Medicines, including newer oral blood thinners (  anticoagulants), warfarin, low molecular weight heparins, thrombolytics, or heparins. °· Wearing compression stockings or using different types of devices. °· Surgery (rare) to remove the blood clot or to place a filter in your abdomen to stop the blood clot from traveling to your lungs. ° °Treatments for a PE are often divided into immediate treatment, long-term treatment (up to 3  months after PE), and extended treatment (more than 3 months after PE). Your treatment may continue for several months. This is called maintenance therapy, and it is used to prevent the forming of new blood clots. You can work with your health care provider to choose the treatment program that is best for you. °What are anticoagulants? °Anticoagulants are medicines that treat PEs. They can stop current blood clots from growing and stop new clots from forming. They cannot dissolve existing clots. Your body dissolves clots by itself over time. Anticoagulants are given by mouth, by injection, or through an IV tube. °What are thrombolytics? °Thrombolytics are clot-dissolving medicines that are used to dissolve a PE. They carry a high risk of bleeding, so they tend to be used only in severe cases or if you have very low blood pressure. °Follow these instructions at home: °If you are taking a newer oral anticoagulant: °· Take the medicine every single day at the same time each day. °· Understand what foods and drugs interact with this medicine. °· Understand that there are no regular blood tests required when using this medicine. °· Understand the side effects of this medicine, including excessive bruising or bleeding. Ask your health care provider or pharmacist about other possible side effects. °If you are taking warfarin: °· Understand how to take warfarin and know which foods can affect how warfarin works in your body. °· Understand that it is dangerous to take too much or too little warfarin. Too much warfarin increases the risk of bleeding. Too little warfarin continues to allow the risk for blood clots. °· Follow your PT and INR blood testing schedule. The PT and INR results allow your health care provider to adjust your dose of warfarin. It is very important that you have your PT and INR tested as often as told by your health care provider. °· Avoid major changes in your diet, or tell your health care provider  before you change your diet. Arrange a visit with a registered dietitian to answer your questions. Many foods, especially foods that are high in vitamin K, can interfere with warfarin and affect the PT and INR results. Eat a consistent amount of foods that are high in vitamin K, such as: °? Spinach, kale, broccoli, cabbage, collard greens, turnip greens, Brussels sprouts, peas, cauliflower, seaweed, and parsley. °? Beef liver and pork liver. °? Green tea. °? Soybean oil. °· Tell your health care provider about any and all medicines, vitamins, and supplements that you take, including aspirin and other over-the-counter anti-inflammatory medicines. Be especially cautious with aspirin and anti-inflammatory medicines. Do not take those before you ask your health care provider if it is safe to do so. This is important because many medicines can interfere with warfarin and affect the PT and INR results. °· Do not start or stop taking any over-the-counter or prescription medicine unless your health care provider or pharmacist tells you to do so. °If you take warfarin, you will also need to do these things: °· Hold pressure over cuts for longer than usual. °· Tell your dentist and other health care providers that you are taking warfarin before you have   any procedures in which bleeding may occur. °· Avoid alcohol or drink very small amounts. Tell your health care provider if you change your alcohol intake. °· Do not use tobacco products, including cigarettes, chewing tobacco, and e-cigarettes. If you need help quitting, ask your health care provider. °· Avoid contact sports. ° °General instructions °· Take over-the-counter and prescription medicines only as told by your health care provider. Anticoagulant medicines can have side effects, including easy bruising and difficulty stopping bleeding. If you are prescribed an anticoagulant, you will also need to do these things: °? Hold pressure over cuts for longer than  usual. °? Tell your dentist and other health care providers that you are taking anticoagulants before you have any procedures in which bleeding may occur. °? Avoid contact sports. °· Wear a medical alert bracelet or carry a medical alert card that says you have had a PE. °· Ask your health care provider how soon you can go back to your normal activities. Stay active to prevent new blood clots from forming. °· Make sure to exercise while traveling or when you have been sitting or standing for a long period of time. It is very important to exercise. Exercise your legs by walking or by tightening and relaxing your leg muscles often. Take frequent walks. °· Wear compression stockings as told by your health care provider to help prevent more blood clots from forming. °· Do not use tobacco products, including cigarettes, chewing tobacco, and e-cigarettes. If you need help quitting, ask your health care provider. °· Keep all follow-up appointments with your health care provider. This is important. °How is this prevented? °Take these actions to decrease your risk of developing another PE: °· Exercise regularly. For at least 30 minutes every day, engage in: °? Activity that involves moving your arms and legs. °? Activity that encourages good blood flow through your body by increasing your heart rate. °· Exercise your arms and legs every hour during long-distance travel (over 4 hours). Drink plenty of water and avoid drinking alcohol while traveling. °· Avoid sitting or lying in bed for long periods of time without moving your legs. °· Maintain a weight that is appropriate for your height. Ask your health care provider what weight is healthy for you. °· If you are a woman who is over 35 years of age, avoid unnecessary use of medicines that contain estrogen. These include birth control pills. °· Do not smoke, especially if you take estrogen medicines. If you need help quitting, ask your health care provider. °· If you are at  very high risk for PE, wear compression stockings. °· If you recently had a PE, have regularly scheduled ultrasound testing on your legs to check for new blood clots. ° °If you are hospitalized, prevention measures may include: °· Early walking after surgery, as soon as your health care provider says that it is safe. °· Receiving anticoagulants to prevent blood clots. If you cannot take anticoagulants, other options may be available, such as wearing compression stockings or using different types of devices. ° °Get help right away if: °· You have new or increased pain, swelling, or redness in an arm or leg. °· You have numbness or tingling in an arm or leg. °· You have shortness of breath while active or at rest. °· You have chest pain. °· You have a rapid or irregular heartbeat. °· You feel light-headed or dizzy. °· You cough up blood. °· You notice blood in your vomit, bowel movement, or   urine. °· You have a fever. °These symptoms may represent a serious problem that is an emergency. Do not wait to see if the symptoms will go away. Get medical help right away. Call your local emergency services (911 in the [place] Do not drive yourself to the hospital. °This information is not intended to replace advice given to you by your health care provider. Make sure you discuss any questions you have with your health care provider. °Document Released: 06/06/2000 Document Revised: 11/15/2015 Document Reviewed: 10/04/2014 °Elsevier Interactive Patient Education © 2017 [employer] ° °

## 2016-07-05 ENCOUNTER — Emergency Department (HOSPITAL_COMMUNITY)
Admission: EM | Admit: 2016-07-05 | Discharge: 2016-07-05 | Disposition: A | Discharge status: Home / Self Care | Payer: Self-pay | Attending: Emergency Medicine | Admitting: Emergency Medicine

## 2016-07-05 ENCOUNTER — Encounter (HOSPITAL_COMMUNITY): Payer: Self-pay | Admitting: Emergency Medicine

## 2016-07-05 DIAGNOSIS — N3001 Acute cystitis with hematuria: Secondary | ICD-10-CM | POA: Insufficient documentation

## 2016-07-05 DIAGNOSIS — Z87891 Personal history of nicotine dependence: Secondary | ICD-10-CM | POA: Insufficient documentation

## 2016-07-05 DIAGNOSIS — Z7901 Long term (current) use of anticoagulants: Secondary | ICD-10-CM | POA: Insufficient documentation

## 2016-07-05 DIAGNOSIS — R319 Hematuria, unspecified: Secondary | ICD-10-CM

## 2016-07-05 DIAGNOSIS — N3090 Cystitis, unspecified without hematuria: Secondary | ICD-10-CM

## 2016-07-05 LAB — CBC WITH DIFFERENTIAL/PLATELET
Basophils Absolute: 0 10*3/uL (ref 0.0–0.1)
Basophils Relative: 0 %
Eosinophils Absolute: 0.3 10*3/uL (ref 0.0–0.7)
Eosinophils Relative: 7 %
HCT: 37.1 % — ABNORMAL LOW (ref 39.0–52.0)
Hemoglobin: 12.3 g/dL — ABNORMAL LOW (ref 13.0–17.0)
Lymphocytes Relative: 51 %
Lymphs Abs: 2.1 10*3/uL (ref 0.7–4.0)
MCH: 28.4 pg (ref 26.0–34.0)
MCHC: 33.2 g/dL (ref 30.0–36.0)
MCV: 85.7 fL (ref 78.0–100.0)
Monocytes Absolute: 0.2 10*3/uL (ref 0.1–1.0)
Monocytes Relative: 4 %
Neutro Abs: 1.6 10*3/uL — ABNORMAL LOW (ref 1.7–7.7)
Neutrophils Relative %: 38 %
Platelets: 233 10*3/uL (ref 150–400)
RBC: 4.33 MIL/uL (ref 4.22–5.81)
RDW: 14.9 % (ref 11.5–15.5)
WBC: 4.2 10*3/uL (ref 4.0–10.5)

## 2016-07-05 LAB — URINALYSIS, ROUTINE W REFLEX MICROSCOPIC: Squamous Epithelial / LPF: NONE SEEN

## 2016-07-05 LAB — BASIC METABOLIC PANEL
Anion gap: 12 (ref 5–15)
BUN: 13 mg/dL (ref 6–20)
CO2: 24 mmol/L (ref 22–32)
Calcium: 9.5 mg/dL (ref 8.9–10.3)
Chloride: 102 mmol/L (ref 101–111)
Creatinine, Ser: 0.94 mg/dL (ref 0.61–1.24)
GFR calc Af Amer: >60 mL/min (ref >60)
GFR calc non Af Amer: >60 mL/min (ref >60)
Glucose, Bld: 116 mg/dL — ABNORMAL HIGH (ref 65–99)
Potassium: 3.8 mmol/L (ref 3.5–5.1)
Sodium: 138 mmol/L (ref 135–145)

## 2016-07-05 MED ORDER — CEPHALEXIN 500 MG PO CAPS
500.0000 mg | ORAL_CAPSULE | Freq: Two times a day (BID) | ORAL | 0 refills | Status: DC
Start: 2016-07-05 — End: 2018-04-09

## 2016-07-05 NOTE — ED Triage Notes (Signed)
Pt. Stated, I've had blood in my urine for 3 days no pain. 3 months ago I was treated for blood clot in my lung at the [facility].

## 2016-07-05 NOTE — ED Provider Notes (Signed)
[facility] DEPT Provider Note   CSN: [id number] Arrival date & time: 07/05/16  1706     History   Chief Complaint Chief Complaint  Patient presents with  . Hematuria  . Abdominal Pain    LLQ    HPI Glen Lambert is a 26 y.o. male.  HPI    26 year old male presents today with complaints of hematuria. Patient reports he had a history of pulmonary embolism and was started on Xarelto 2 months ago. He reports shortly after starting Xarelto he had 2 days of bloody urine that resolved on its own. Patient notes 3 days ago he started to have blood in his urine again, but this did not go away. He denies any preceding symptoms, he denies any abdominal pain, fever, chills, nausea, vomiting, back pain, or changes in the frequency or characteristics of his urine other than color. Patient denies any shortness of breath or chest pain. Patient denies any discharge or STD related symptoms.    Past Medical History:  Diagnosis Date  . 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 Opioid abuse, in remission   . Pulmonary embolism [facility])     Patient Active Problem List   Diagnosis Date Noted  . DVT of lower extremity (deep venous thrombosis) ([place]) 04/30/2016  . PE (pulmonary thromboembolism) ([place]) 04/26/2016  . Opioid abuse, in remission 04/26/2016  . 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 Tobacco abuse 04/26/2016  . Chest pain 04/26/2016  . Acute pulmonary embolism ([place]) 04/26/2016    Past Surgical History:  Procedure Laterality Date  . ANKLE SURGERY    . JOINT REPLACEMENT         Home Medications    Prior to Admission medications   Medication Sig Start Date End Date Taking? Authorizing Provider  acetaminophen (TYLENOL) 325 MG tablet Take 2 tablets (650 mg total) by mouth every 4 (four) hours as needed for mild pain, moderate pain, fever or headache. 04/29/16   Silver Huguenin Elgergawy, MD  cephALEXin (KEFLEX) 500 MG capsule Take 1 capsule (500 mg total) by  mouth 2 (two) times daily. 07/05/16   Okey Regal, PA-C  methadone (DOLOPHINE) 10 MG/ML solution Take 100 mg by mouth daily.     Historical Provider, MD  nicotine (NICODERM CQ) 14 mg/24hr patch Place 1 patch (14 mg total) onto the skin daily. 06/24/16   Arnoldo Morale, MD  oxyCODONE-acetaminophen (PERCOCET/ROXICET) 5-325 MG tablet Take 1 tablet by mouth every 6 (six) hours as needed for severe pain. Patient not taking: Reported on 06/24/2016 05/18/16   Delos Haring, PA-C  rivaroxaban (XARELTO) 20 MG TABS tablet Take 1 tablet (20 mg total) by mouth daily with supper. 06/24/16   Arnoldo Morale, MD    Family History Family History  Problem Relation Age of Onset  . Pulmonary embolism Maternal Grandfather     Social History Social History  Substance Use Topics  . Smoking status: Former Smoker    Packs/day: 0.25    Types: Cigarettes    Quit date: 03/25/2016  . Smokeless tobacco: Former Systems developer     Comment: 1-2 cigs daily  . Alcohol use 0.6 oz/week    1 Cans of beer per week     Comment: Occasional     Allergies   Patient has no known allergies.   Review of Systems Review of Systems  All other systems reviewed and are negative.    Physical Exam Updated Vital Signs BP 129/82   Pulse 64   Temp 98 F (36.7  C) (Oral)   Resp 18   Ht 6\' 2"  (1.88 m)   SpO2 99%   Physical Exam  Constitutional: He is oriented to person, place, and time. He appears well-developed and well-nourished.  HENT:  Head: Normocephalic and atraumatic.  Eyes: Conjunctivae are normal. Pupils are equal, round, and reactive to light. Right eye exhibits no discharge. Left eye exhibits no discharge. No scleral icterus.  Neck: Normal range of motion. No JVD present. No tracheal deviation present.  Pulmonary/Chest: Effort normal. No stridor.  Abdominal: Soft. He exhibits no distension and no mass. There is no tenderness. There is no rebound and no guarding. No hernia.  No CVA tenderness  Neurological: He is alert and  oriented to person, place, and time. Coordination normal.  Psychiatric: He has a normal mood and affect. His behavior is normal. Judgment and thought content normal.  Nursing note and vitals reviewed.    ED Treatments / Results  Labs (all labs ordered are listed, but only abnormal results are displayed) Labs Reviewed  URINALYSIS, ROUTINE W REFLEX MICROSCOPIC - Abnormal; Notable for the following:       Result Value   Color, Urine RED (*)    APPearance TURBID (*)    Glucose, UA   (*)    Value: TEST NOT REPORTED DUE TO COLOR INTERFERENCE OF URINE PIGMENT   Hgb urine dipstick   (*)    Value: TEST NOT REPORTED DUE TO COLOR INTERFERENCE OF URINE PIGMENT   Bilirubin Urine   (*)    Value: TEST NOT REPORTED DUE TO COLOR INTERFERENCE OF URINE PIGMENT   Ketones, ur   (*)    Value: TEST NOT REPORTED DUE TO COLOR INTERFERENCE OF URINE PIGMENT   Protein, ur   (*)    Value: TEST NOT REPORTED DUE TO COLOR INTERFERENCE OF URINE PIGMENT   Nitrite   (*)    Value: TEST NOT REPORTED DUE TO COLOR INTERFERENCE OF URINE PIGMENT   Leukocytes, UA   (*)    Value: TEST NOT REPORTED DUE TO COLOR INTERFERENCE OF URINE PIGMENT   Bacteria, UA MANY (*)    All other components within normal limits  CBC WITH DIFFERENTIAL/PLATELET - Abnormal; Notable for the following:    Hemoglobin 12.3 (*)    HCT 37.1 (*)    Neutro Abs 1.6 (*)    All other components within normal limits  BASIC METABOLIC PANEL - Abnormal; Notable for the following:    Glucose, Bld 116 (*)    All other components within normal limits  GC/CHLAMYDIA PROBE AMP ([place]) NOT AT [facility]    EKG  EKG Interpretation None       Radiology No results found.  Procedures Procedures (including critical care time)  Medications Ordered in ED Medications - No data to display   Initial Impression / Assessment and Plan / ED Course  I have reviewed the triage vital signs and the nursing notes.  Pertinent labs & imaging results that were  available during my care of the patient were reviewed by me and considered in my medical decision making (see chart for details).  Clinical Course     Labs: CBC, BMP, urinalysis  Imaging:  Consults:  Therapeutics:  Discharge Meds:   Assessment/Plan:26 year old male presents today with hematuria. Patient has had an episode of this in the past that resolved on its own. He has signs consistent with urinary tract infection as well. Patient has no abdominal pain, fever, flank pain, or any other concerning signs  or symptoms. Patient will be treated for UTI, also gonorrhea Chlamydia testing ordered. Patient encouraged follow-up with primary care, follow-up with urology. Patient given strict return caution's, he verbalized understanding and agreement to today's plan had no further questions or concerns at time of discharge    Final Clinical Impressions(s) / ED Diagnoses   Final diagnoses:  Hematuria, unspecified type  Cystitis    New Prescriptions New Prescriptions   CEPHALEXIN (KEFLEX) 500 MG CAPSULE    Take 1 capsule (500 mg total) by mouth 2 (two) times daily.     Okey Regal, PA-C 07/05/16 2238    Orlie Dakin, MD 07/06/16 [phone]

## 2016-07-05 NOTE — ED Notes (Signed)
Pt understood dc material. NAD noted. Scripts and work noted given at [employer]

## 2016-07-05 NOTE — Discharge Instructions (Signed)
Please read attached information. If you experience any new or worsening signs or symptoms please return to the emergency room for evaluation. Please follow-up with your primary care provider or specialist as discussed. Please use medication prescribed only as directed and discontinue taking if you have any concerning signs or symptoms.   °

## 2016-07-07 LAB — GC/CHLAMYDIA PROBE AMP (~~LOC~~) NOT AT ARMC
Chlamydia: NEGATIVE
Neisseria Gonorrhea: NEGATIVE

## 2016-07-28 ENCOUNTER — Telehealth: Payer: Self-pay | Admitting: Family Medicine

## 2016-07-28 NOTE — Telephone Encounter (Signed)
Patient came in stating that he took his last Xarelto this morning and would like to know if he can or should get a refill. Has an appt on 08/11/16 with Dr Jarold Song. Please f/u

## 2016-07-29 NOTE — Telephone Encounter (Signed)
He needs an office visit with me as he is due for a hypercoagulable labs to determine if he needs to stay on anticoagulation for life. Hold off on Xarelto until he sees me.

## 2016-07-29 NOTE — Telephone Encounter (Signed)
Patient has been scheduled to see Dr. Jarold Song on 08/07/16.

## 2016-08-07 ENCOUNTER — Ambulatory Visit: Payer: Self-pay | Attending: Family Medicine | Admitting: Family Medicine

## 2016-08-07 ENCOUNTER — Encounter: Payer: Self-pay | Admitting: Family Medicine

## 2016-08-07 VITALS — BP 130/78 | HR 75 | Temp 98.2°F | Resp 20 | Wt 258.6 lb

## 2016-08-07 DIAGNOSIS — I2699 Other pulmonary embolism without acute cor pulmonale: Secondary | ICD-10-CM | POA: Insufficient documentation

## 2016-08-07 DIAGNOSIS — Z7901 Long term (current) use of anticoagulants: Secondary | ICD-10-CM | POA: Insufficient documentation

## 2016-08-07 DIAGNOSIS — D6851 Activated protein C resistance: Secondary | ICD-10-CM | POA: Insufficient documentation

## 2016-08-07 DIAGNOSIS — Z5189 Encounter for other specified aftercare: Secondary | ICD-10-CM | POA: Insufficient documentation

## 2016-08-07 DIAGNOSIS — I82442 Acute embolism and thrombosis of left tibial vein: Secondary | ICD-10-CM | POA: Insufficient documentation

## 2016-08-07 NOTE — Addendum Note (Signed)
Addended by: Trecia Rogers on: 08/07/2016 09:56 AM   Modules accepted: Orders

## 2016-08-07 NOTE — Progress Notes (Signed)
Subjective:  Patient ID: Glen Lambert, male    DOB: 1990-11-10  Age: 26 y.o. MRN: [id number]  CC: follow up on DVT and pulmonary embolism  HPI Glen Lambert is a 26 year old male with a history of tobacco abuse, unprovoked left peroneal DVT and acute PE diagnosed during a hospitalization in 04/2016 who has been on anticoagulation with Xarelto for the last 3 months.  He has been off Xarelto for the last 2 weeks and the plan is to send off a hypercoagulable panel. He denies shortness of breath or calf pain; history of DVT in his grandmother after surgery no other family histories of blood clots.  Past Medical History:  Diagnosis Date  . 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 Opioid abuse, in remission   . Pulmonary embolism [facility])     Past Surgical History:  Procedure Laterality Date  . ANKLE SURGERY    . JOINT REPLACEMENT      No Known Allergies   Outpatient Medications Prior to Visit  Medication Sig Dispense Refill  . acetaminophen (TYLENOL) 325 MG tablet Take 2 tablets (650 mg total) by mouth every 4 (four) hours as needed for mild pain, moderate pain, fever or headache.    . methadone (DOLOPHINE) 10 MG/ML solution Take 100 mg by mouth daily.     . rivaroxaban (XARELTO) 20 MG TABS tablet Take 1 tablet (20 mg total) by mouth daily with supper. 30 tablet 0  . cephALEXin (KEFLEX) 500 MG capsule Take 1 capsule (500 mg total) by mouth 2 (two) times daily. (Patient not taking: Reported on 08/07/2016) 10 capsule 0  . nicotine (NICODERM CQ) 14 mg/24hr patch Place 1 patch (14 mg total) onto the skin daily. (Patient not taking: Reported on 08/07/2016) 28 patch 1  . oxyCODONE-acetaminophen (PERCOCET/ROXICET) 5-325 MG tablet Take 1 tablet by mouth every 6 (six) hours as needed for severe pain. (Patient not taking: Reported on 06/24/2016) 15 tablet 0   No facility-administered medications prior to visit.     ROS Review of Systems  Constitutional: Negative for activity change and  appetite change.  HENT: Negative for sinus pressure and sore throat.   Respiratory: Negative for chest tightness, shortness of breath and wheezing.   Cardiovascular: Negative for chest pain and palpitations.  Gastrointestinal: Negative for abdominal distention, abdominal pain and constipation.  Genitourinary: Negative.   Musculoskeletal: Negative.   Psychiatric/Behavioral: Negative for behavioral problems and dysphoric mood.    Objective:  BP 130/78 (BP Location: Right Arm, Patient Position: Sitting, Cuff Size: Large)   Pulse 75   Temp 98.2 F (36.8 C) (Oral)   Resp 20   Wt 258 lb 9.6 oz (117.3 kg)   SpO2 97%   BMI 33.20 kg/m   BP/Weight 08/07/2016 [id number]  Systolic BP [id number]  Diastolic BP 78 82 82  Wt. (Lbs) 258.6 - 254.6  BMI 33.2 - 32.69      Physical Exam  Constitutional: He is oriented to person, place, and time. He appears well-developed and well-nourished.  Cardiovascular: Normal rate, normal heart sounds and intact distal pulses.   No murmur heard. Pulmonary/Chest: Effort normal and breath sounds normal. He has no wheezes. He has no rales. He exhibits no tenderness.  Abdominal: Soft. Bowel sounds are normal. He exhibits no distension and no mass. There is no tenderness.  Musculoskeletal: Normal range of motion.  Neurological: He is alert and oriented to person, place, and time.     Assessment & Plan:   1. Other  acute pulmonary embolism without acute cor pulmonale (HCC) Currently off Xarelto Will send off hypercoagulable panel and if positive resume Xarelto - Factor V Leiden - Lupus anticoagulant panel - Homocysteine - Factor 2 assay - Protein C, total and functional panel - Protein S, total and functional panel  2. Acute deep vein thrombosis (DVT) of tibial vein of left lower extremity (HCC) - Factor V Leiden - Lupus anticoagulant panel - Homocysteine - Factor 2 assay - Protein C, total and functional panel - Protein S, total and  functional panel  3. Tobacco abuse Spent 3 minutes minutes counseling on cessation. He is working on quitting.  No orders of the defined types were placed in this encounter.   Follow-up: Return in about 3 months (around 11/04/2016) for coordination of care.   Arnoldo Morale MD

## 2016-08-07 NOTE — Patient Instructions (Signed)
Steps to Quit Smoking Smoking tobacco can be harmful to your health and can affect almost every organ in your body. Smoking puts you, and those around you, at risk for developing many serious chronic diseases. Quitting smoking is difficult, but it is one of the best things that you can do for your health. It is never too late to quit. What are the benefits of quitting smoking? When you quit smoking, you lower your risk of developing serious diseases and conditions, such as:  Lung cancer or lung disease, such as COPD.  Heart disease.  Stroke.  Heart attack.  Infertility.  Osteoporosis and bone fractures.  Additionally, symptoms such as coughing, wheezing, and shortness of breath may get better when you quit. You may also find that you get sick less often because your body is stronger at fighting off colds and infections. If you are pregnant, quitting smoking can help to reduce your chances of having a baby of low birth weight. How do I get ready to quit? When you decide to quit smoking, create a plan to make sure that you are successful. Before you quit:  Pick a date to quit. Set a date within the next two weeks to give you time to prepare.  Write down the reasons why you are quitting. Keep this list in places where you will see it often, such as on your bathroom mirror or in your car or wallet.  Identify the people, places, things, and activities that make you want to smoke (triggers) and avoid them. Make sure to take these actions: ? Throw away all cigarettes at home, at work, and in your car. ? Throw away smoking accessories, such as ashtrays and lighters. ? Clean your car and make sure to empty the ashtray. ? Clean your home, including curtains and carpets.  Tell your family, friends, and coworkers that you are quitting. Support from your loved ones can make quitting easier.  Talk with your health care provider about your options for quitting smoking.  Find out what treatment  options are covered by your health insurance.  What strategies can I use to quit smoking? Talk with your healthcare provider about different strategies to quit smoking. Some strategies include:  Quitting smoking altogether instead of gradually lessening how much you smoke over a period of time. Research shows that quitting "cold turkey" is more successful than gradually quitting.  Attending in-person counseling to help you build problem-solving skills. You are more likely to have success in quitting if you attend several counseling sessions. Even short sessions of 10 minutes can be effective.  Finding resources and support systems that can help you to quit smoking and remain smoke-free after you quit. These resources are most helpful when you use them often. They can include: ? Online chats with a counselor. ? Telephone quitlines. ? Printed self-help materials. ? Support groups or group counseling. ? Text messaging programs. ? Mobile phone applications.  Taking medicines to help you quit smoking. (If you are pregnant or breastfeeding, talk with your health care provider first.) Some medicines contain nicotine and some do not. Both types of medicines help with cravings, but the medicines that include nicotine help to relieve withdrawal symptoms. Your health care provider may recommend: ? Nicotine patches, gum, or lozenges. ? Nicotine inhalers or sprays. ? Non-nicotine medicine that is taken by mouth.  Talk with your health care provider about combining strategies, such as taking medicines while you are also receiving in-person counseling. Using these two strategies together   makes you more likely to succeed in quitting than if you used either strategy on its own. If you are pregnant or breastfeeding, talk with your health care provider about finding counseling or other support strategies to quit smoking. Do not take medicine to help you quit smoking unless told to do so by your health care  provider. What things can I do to make it easier to quit? Quitting smoking might feel overwhelming at first, but there is a lot that you can do to make it easier. Take these important actions:  Reach out to your family and friends and ask that they support and encourage you during this time. Call telephone quitlines, reach out to support groups, or work with a counselor for support.  Ask people who smoke to avoid smoking around you.  Avoid places that trigger you to smoke, such as bars, parties, or smoke-break areas at work.  Spend time around people who do not smoke.  Lessen stress in your life, because stress can be a smoking trigger for some people. To lessen stress, try: ? Exercising regularly. ? Deep-breathing exercises. ? Yoga. ? Meditating. ? Performing a body scan. This involves closing your eyes, scanning your body from head to toe, and noticing which parts of your body are particularly tense. Purposefully relax the muscles in those areas.  Download or purchase mobile phone or tablet apps (applications) that can help you stick to your quit plan by providing reminders, tips, and encouragement. There are many free apps, such as QuitGuide from the CDC (Centers for Disease Control and Prevention). You can find other support for quitting smoking (smoking cessation) through smokefree.gov and other websites.  How will I feel when I quit smoking? Within the first 24 hours of quitting smoking, you may start to feel some withdrawal symptoms. These symptoms are usually most noticeable 2-3 days after quitting, but they usually do not last beyond 2-3 weeks. Changes or symptoms that you might experience include:  Mood swings.  Restlessness, anxiety, or irritation.  Difficulty concentrating.  Dizziness.  Strong cravings for sugary foods in addition to nicotine.  Mild weight gain.  Constipation.  Nausea.  Coughing or a sore throat.  Changes in how your medicines work in your  body.  A depressed mood.  Difficulty sleeping (insomnia).  After the first 2-3 weeks of quitting, you may start to notice more positive results, such as:  Improved sense of smell and taste.  Decreased coughing and sore throat.  Slower heart rate.  Lower blood pressure.  Clearer skin.  The ability to breathe more easily.  Fewer sick days.  Quitting smoking is very challenging for most people. Do not get discouraged if you are not successful the first time. Some people need to make many attempts to quit before they achieve long-term success. Do your best to stick to your quit plan, and talk with your health care provider if you have any questions or concerns. This information is not intended to replace advice given to you by your health care provider. Make sure you discuss any questions you have with your health care provider. Document Released: 06/03/2001 Document Revised: 02/05/2016 Document Reviewed: 10/24/2014 Elsevier Interactive Patient Education  2017 [employer].  

## 2016-08-08 LAB — HOMOCYSTEINE: HOMOCYSTEINE: 11.7 umol/L — AB (ref <11.4)

## 2016-08-11 LAB — RFX DRVVT SCR W/RFLX CONF 1:1 MIX: dRVVT Screen: 42 s (ref <=45)

## 2016-08-11 LAB — RFX PTT-LA W/RFX TO HEX PHASE CONF: PTT-LA Screen: 35 s (ref <=40)

## 2016-08-11 LAB — LUPUS ANTICOAGULANT PANEL

## 2016-08-12 LAB — PROTEIN C, TOTAL AND FUNCTIONAL PANEL
Protein C Activity: 116 % (ref 70–180)
Protein C Antigen: 96 % (ref 70–140)

## 2016-08-12 LAB — FACTOR 2 ASSAY: FACTOR II ACTIVITY: 119 % (ref 70–150)

## 2016-08-12 LAB — PROTEIN S, TOTAL AND FUNCTIONAL PANEL
PROTEIN S ANTIGEN, TOTAL: 101 % (ref 70–140)
Protein S Activity: 75 % (ref 70–150)

## 2016-08-15 ENCOUNTER — Telehealth: Payer: Self-pay

## 2016-08-15 ENCOUNTER — Telehealth: Payer: Self-pay | Admitting: Family Medicine

## 2016-08-15 NOTE — Telephone Encounter (Signed)
Pt. Came into facility requesting lab results front his last OV. Please f/u

## 2016-08-15 NOTE — Telephone Encounter (Signed)
-----   Message from Arnoldo Morale, MD sent at 08/13/2016  1:23 PM EST ----- Please inform him he does not need to take Xarelto any longer as hypercoagulable labs are normal.

## 2016-08-15 NOTE — Telephone Encounter (Signed)
Writer spoke with patient, per Dr. Jarold Song patient no longer needs to take Xarelto.  Patient states that he has been off the xarelto for over a month.

## 2017-05-04 IMAGING — CR DG CHEST 2V
2 series · 2 of 2 positions shown · non-contrast
Comparison: 04/28/2016

CLINICAL DATA: Chest pain for couple of hours. History of blood
clot on the left side 1 week ago. History of hypertension.
Ex-smoker.

EXAM:
CHEST  2 VIEW

[chest pa]
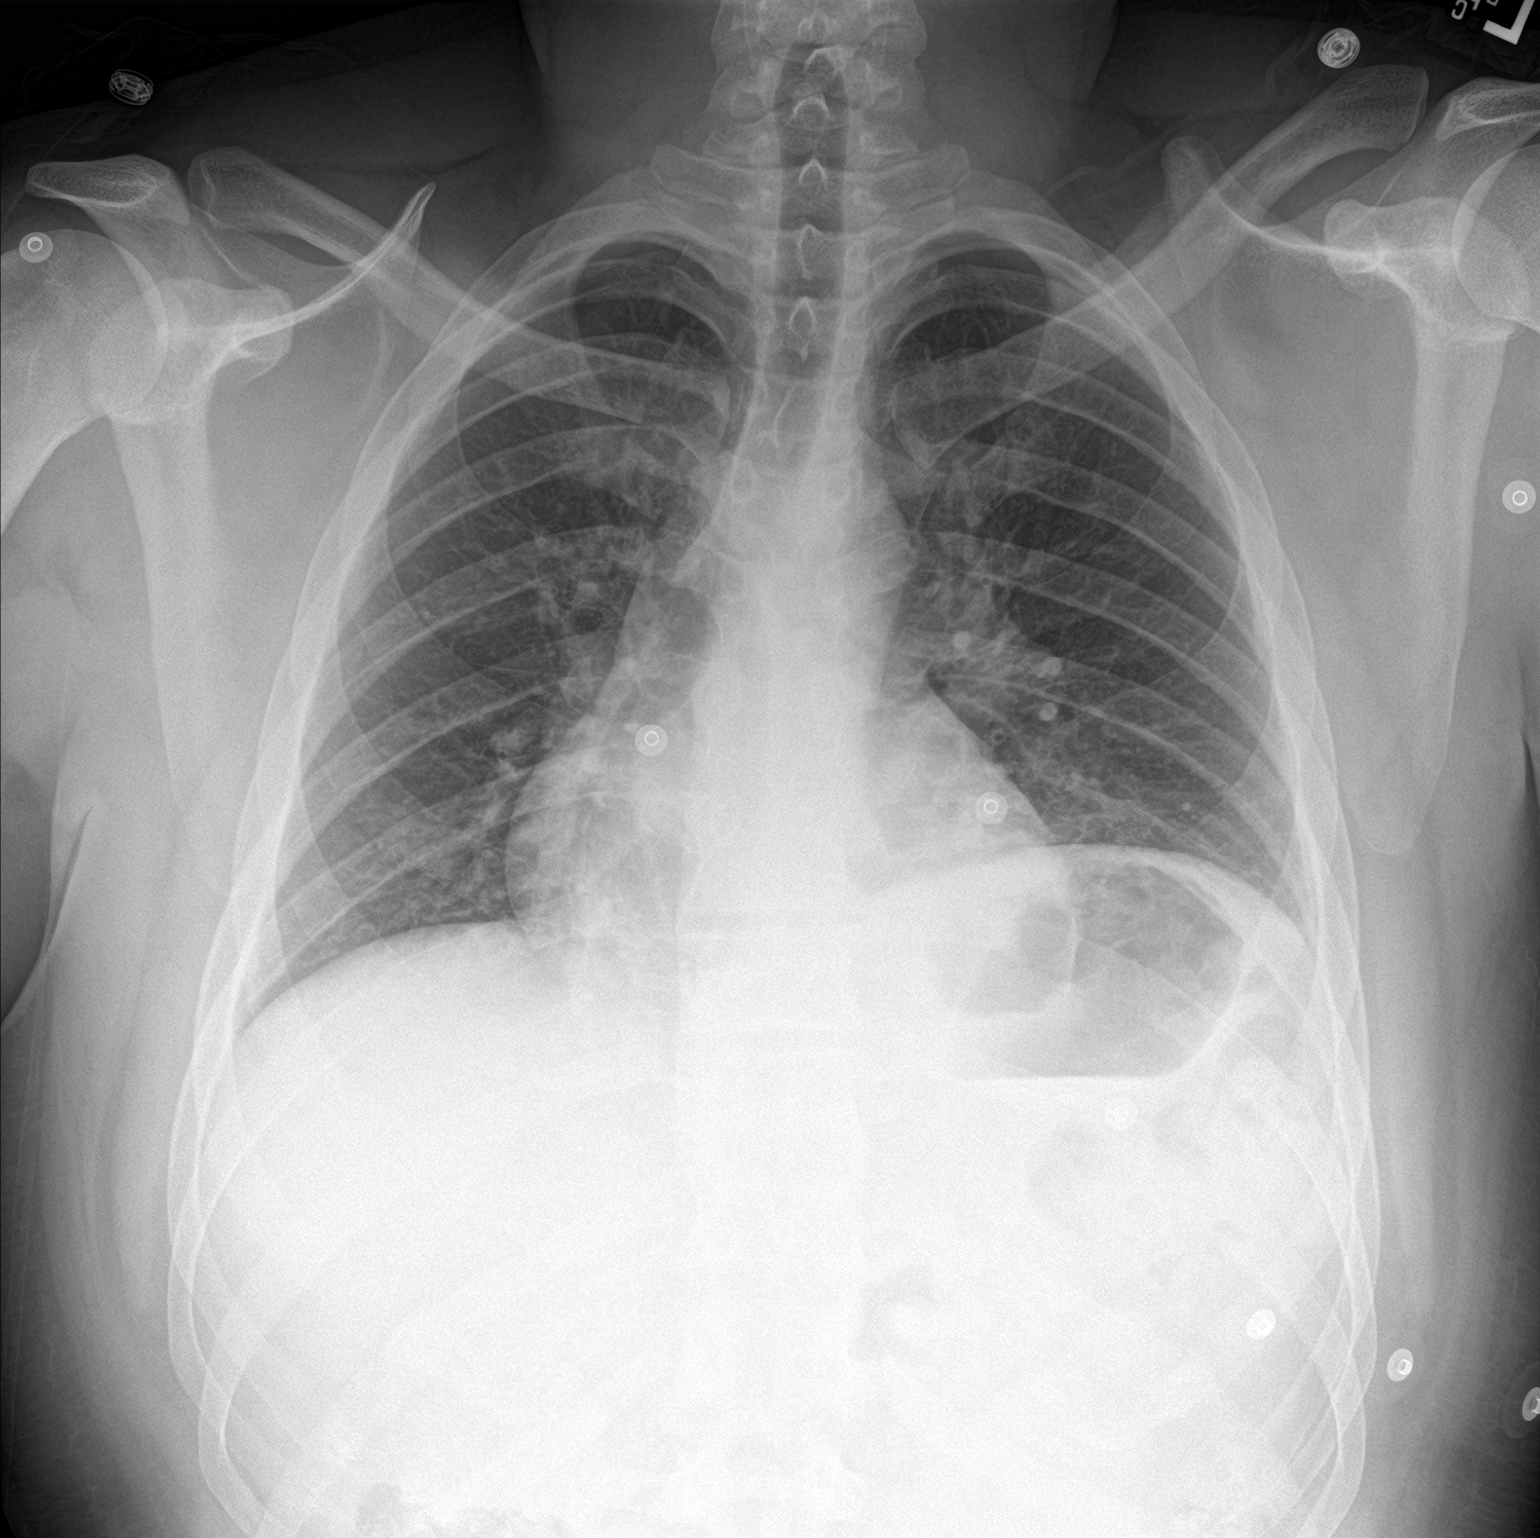

[chest lat]
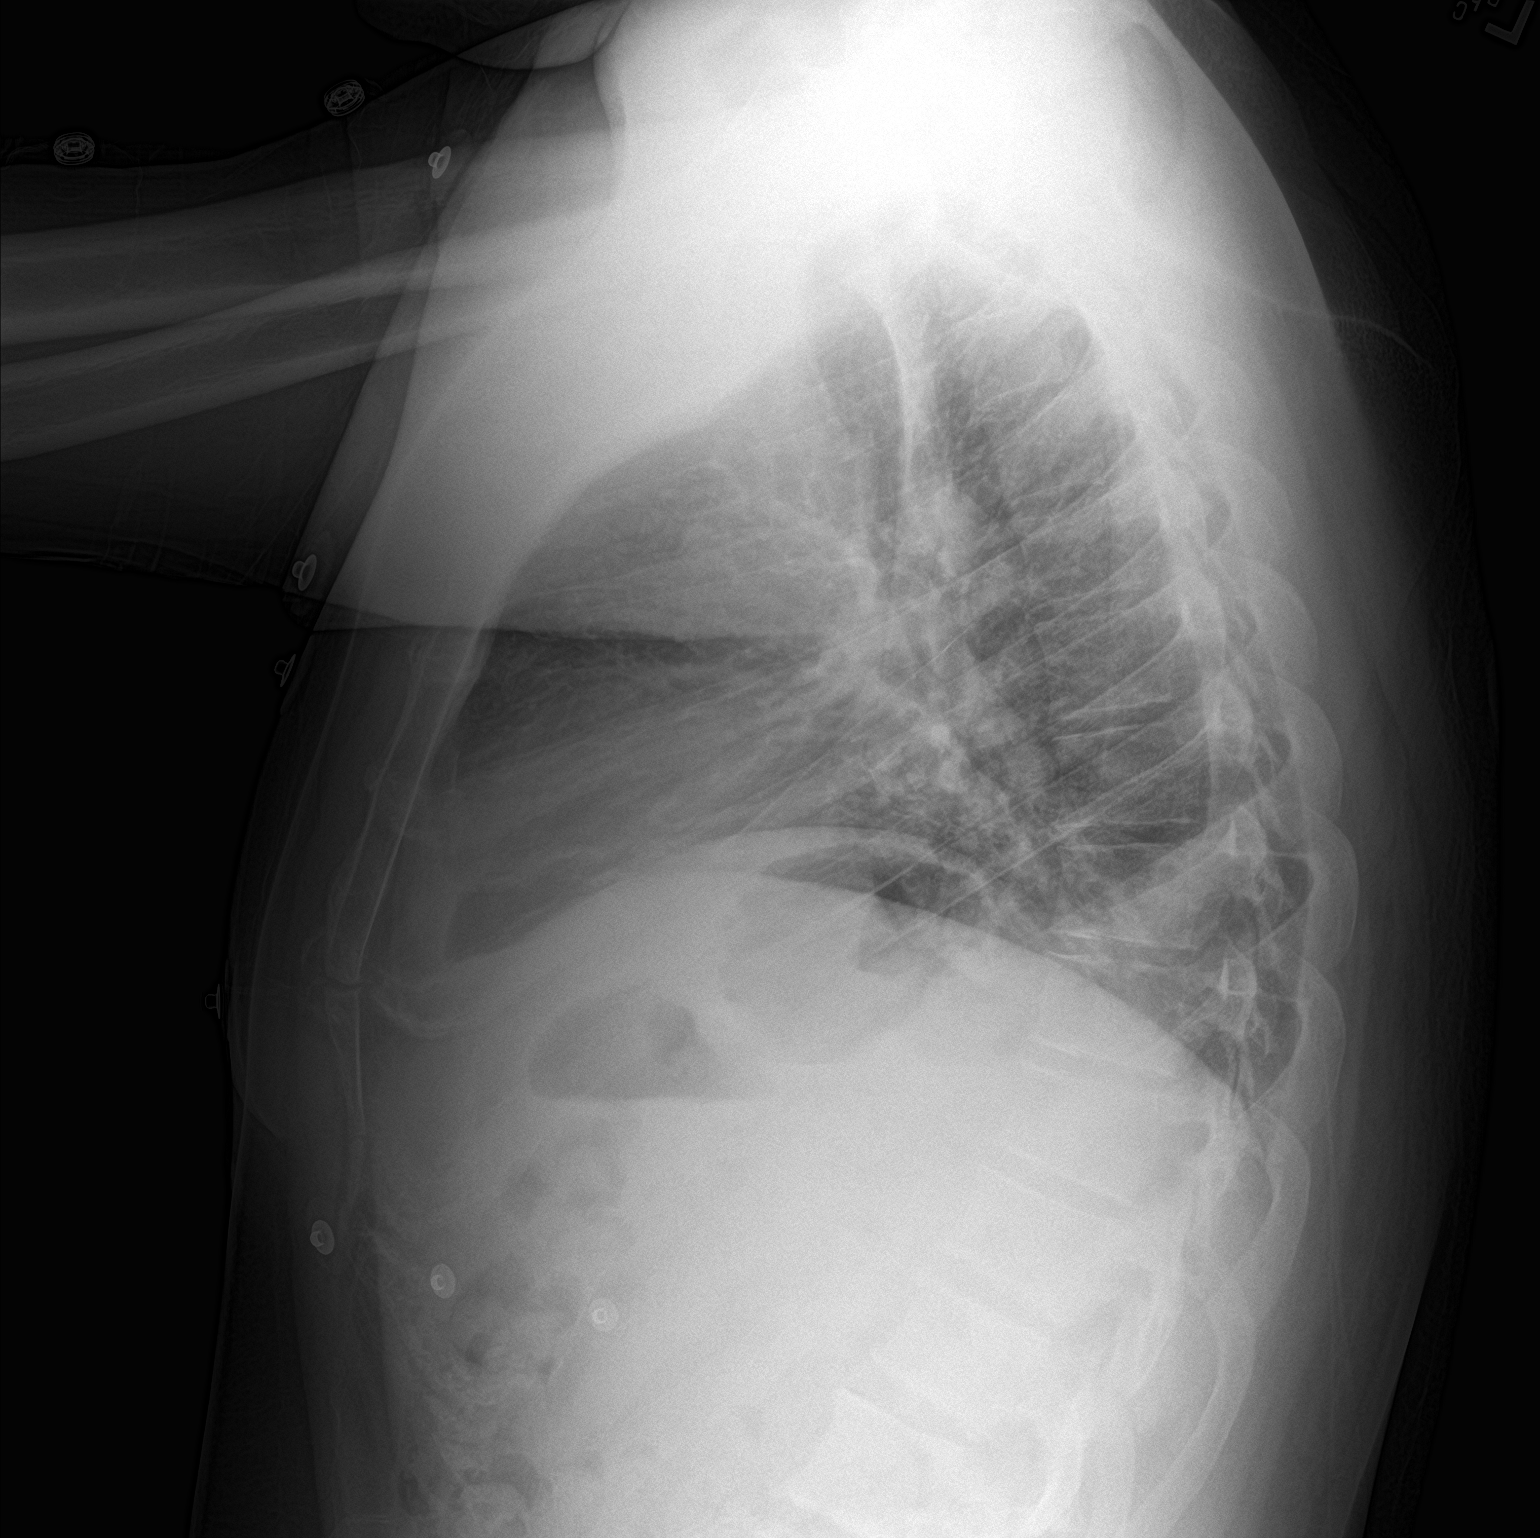

[2 of 2 positions shown; findings below may reference images not displayed]

FINDINGS: Normal heart size and pulmonary vascularity. Shallow inspiration
with linear atelectasis in the lung bases. Previous effusions and
atelectasis have decreased since the prior study. No blunting of
costophrenic angles. No pneumothorax. No focal consolidation or
airspace disease. Mediastinal contours appear intact.
IMPRESSION: Shallow inspiration with linear atelectasis, improving since
previous study. Previous pleural effusions have resolved.

## 2017-05-04 IMAGING — CT CT ANGIO CHEST
1 of 8 series · 13 of 36 positions shown · IV contrast (Iohexol (Omnipaque 350))
Comparison: 04/26/2016 CT chest.

CLINICAL DATA: 25 y/o M; pulmonary embolus with worsening chest
pain and shortness of breath.

EXAM:
CT ANGIOGRAPHY CHEST WITH CONTRAST
TECHNIQUE: Multidetector CT imaging of the chest was performed using the
standard protocol during bolus administration of intravenous
contrast. Multiplanar CT image reconstructions and MIPs were
obtained to evaluate the vascular anatomy.
CONTRAST:  100 cc Isovue 370.

[Series 506: thins pacs · axial · 0.68mm/px · z∈[-359,-131]mm · 13 of 266 slices shown]
[im 19/266  lung]
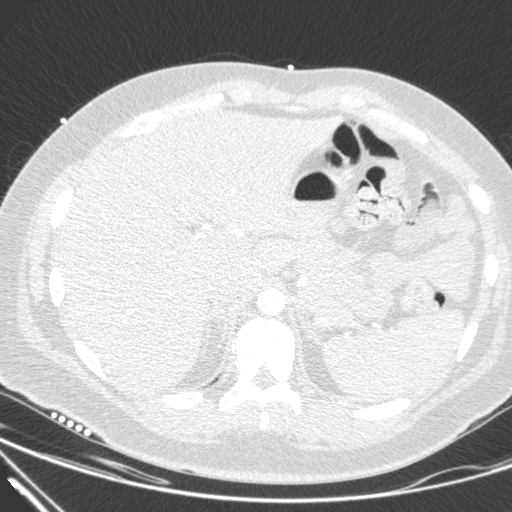
[im 38/266  mediastinal]
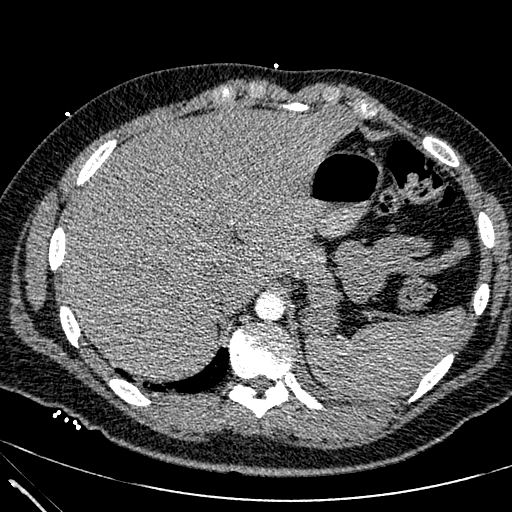
[im 57/266  lung]
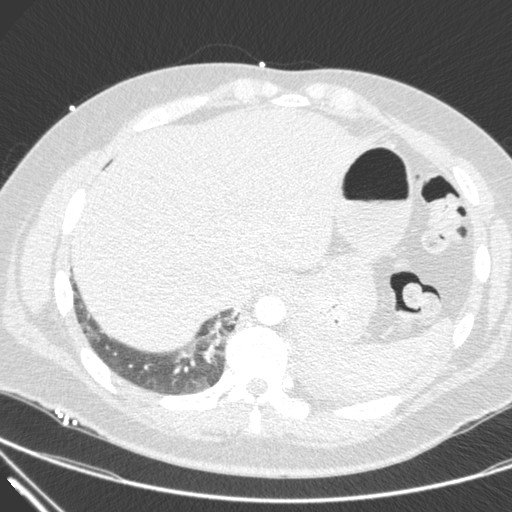
[im 76/266  mediastinal]
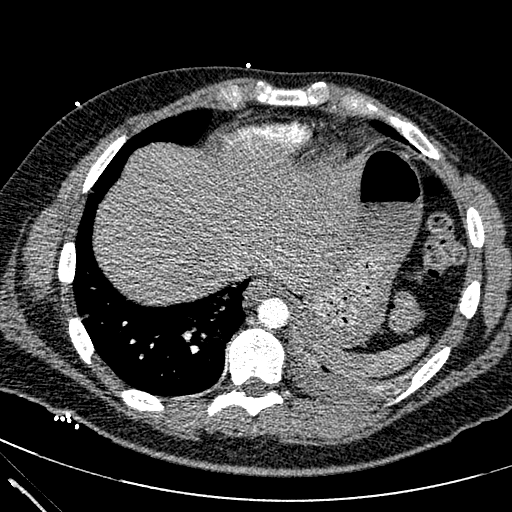
[im 95/266  lung]
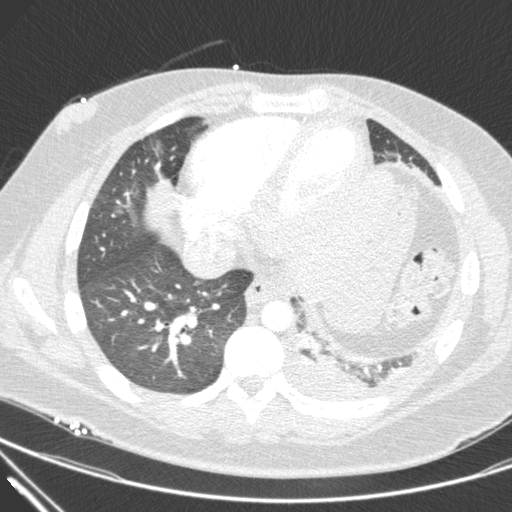
[im 114/266  mediastinal]
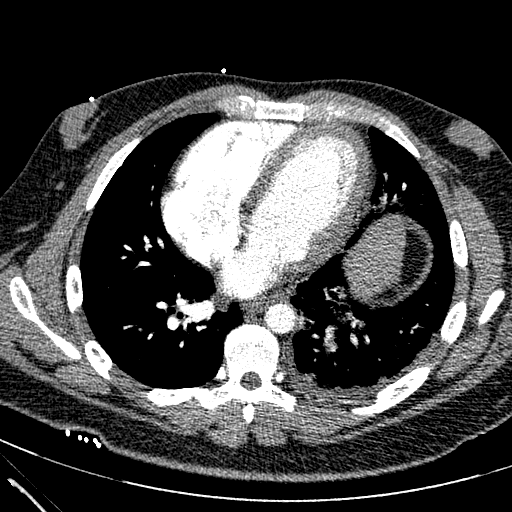
[im 133/266  lung]
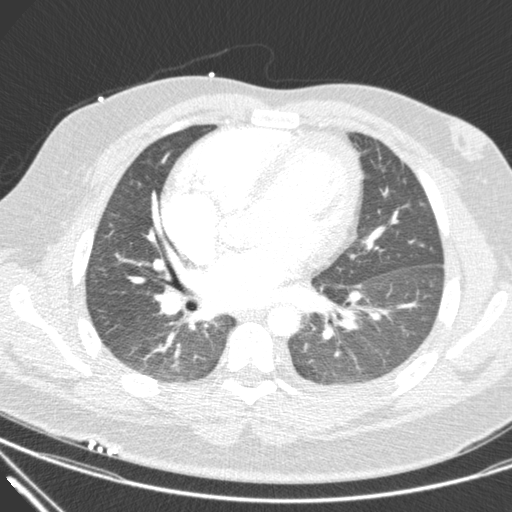
[im 152/266  mediastinal]
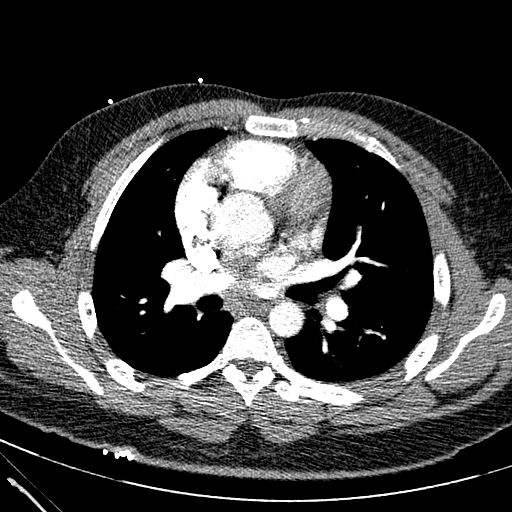
[im 171/266  lung]
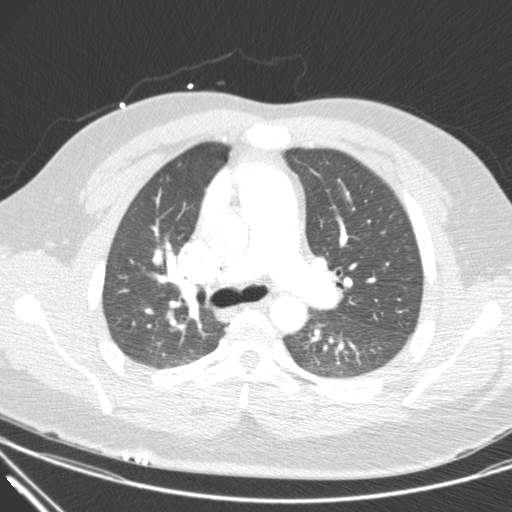
[im 190/266  mediastinal]
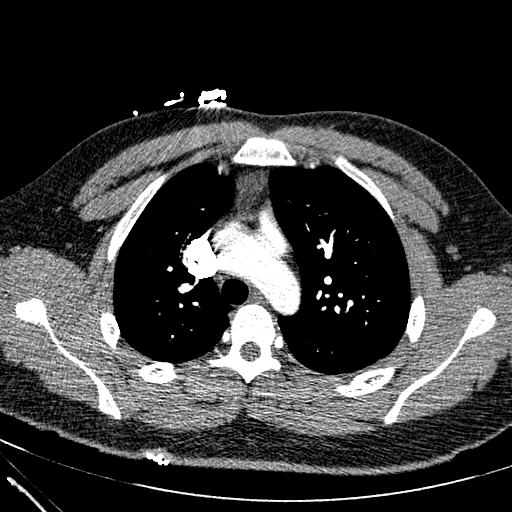
[im 209/266  lung]
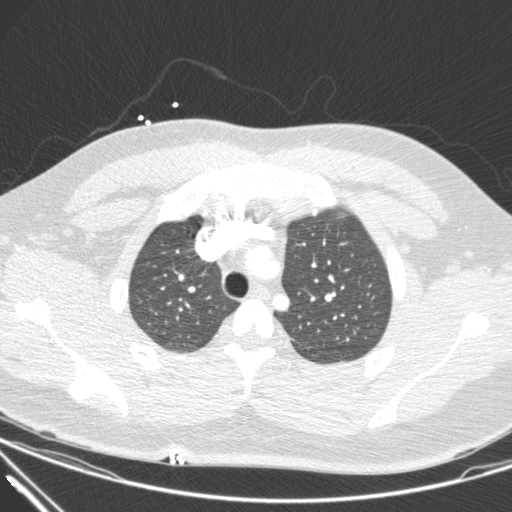
[im 228/266  mediastinal]
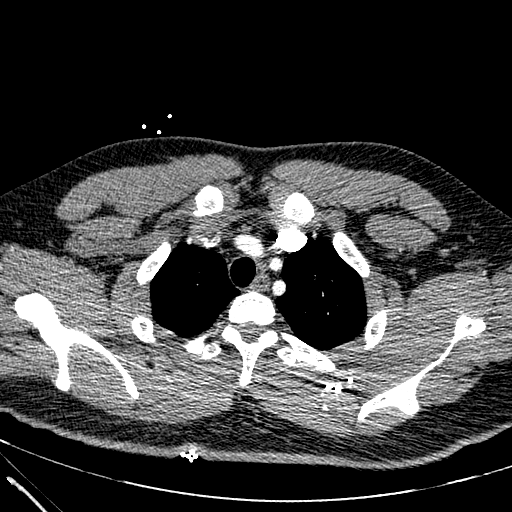
[im 247/266  lung]
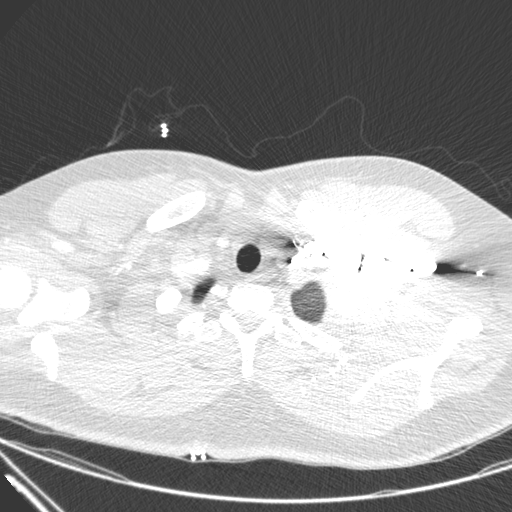

[13 of 36 positions shown; findings below may reference images not displayed]

FINDINGS: Cardiovascular: Satisfactory opacification of pulmonary arteries to
the segmental level. Pulmonary embolus is seen within the left lower
lobe posterior medial basal segments. No new pulmonary embolus is
identified. No evidence for right heart strain.

Mediastinum/Nodes: No enlarged mediastinal, hilar, or axillary lymph
nodes. Thyroid gland, trachea, and esophagus demonstrate no
significant findings.

Lungs/Pleura: Consolidation and ground-glass go glass opacity within
the posterior left lower lobe probably represents evolving pulmonary
infarct. There is a new small left pleural effusion. No new airspace
disease.

Upper Abdomen: No acute abnormality.

Musculoskeletal: Gynecomastia.  Bones are unremarkable.

Review of the MIP images confirms the above findings.
IMPRESSION: 1. Stable pulmonary embolus within the left lower lobe with medial
and posterior basal segments. No evidence for new pulmonary embolus.
2. Left posterior lower lobe consolidation and ground-glass opacity
is compatible with a pulmonary infarct unchanged in distribution
from prior CT. New small left effusion. No new airspace disease.

By: Abimelk Tiger M.D.

## 2018-04-08 ENCOUNTER — Other Ambulatory Visit: Payer: Self-pay

## 2018-04-08 ENCOUNTER — Encounter (HOSPITAL_COMMUNITY): Payer: Self-pay | Admitting: Emergency Medicine

## 2018-04-08 ENCOUNTER — Emergency Department (HOSPITAL_COMMUNITY)
Admission: EM | Admit: 2018-04-08 | Discharge: 2018-04-09 | Disposition: A | Discharge status: Home / Self Care | Payer: Self-pay | Attending: Emergency Medicine | Admitting: Emergency Medicine

## 2018-04-08 DIAGNOSIS — Z87891 Personal history of nicotine dependence: Secondary | ICD-10-CM | POA: Insufficient documentation

## 2018-04-08 DIAGNOSIS — Z9660 Presence of unspecified orthopedic joint implant: Secondary | ICD-10-CM | POA: Insufficient documentation

## 2018-04-08 DIAGNOSIS — Z7901 Long term (current) use of anticoagulants: Secondary | ICD-10-CM | POA: Insufficient documentation

## 2018-04-08 DIAGNOSIS — Z79899 Other long term (current) drug therapy: Secondary | ICD-10-CM | POA: Insufficient documentation

## 2018-04-08 DIAGNOSIS — N3000 Acute cystitis without hematuria: Secondary | ICD-10-CM | POA: Insufficient documentation

## 2018-04-08 LAB — URINALYSIS, ROUTINE W REFLEX MICROSCOPIC
BILIRUBIN URINE: NEGATIVE
Glucose, UA: NEGATIVE mg/dL
Ketones, ur: NEGATIVE mg/dL
Nitrite: NEGATIVE
Protein, ur: NEGATIVE mg/dL
SPECIFIC GRAVITY, URINE: 1.009 (ref 1.005–1.030)
pH: 6 (ref 5.0–8.0)

## 2018-04-08 LAB — CBC
HCT: 42.4 % (ref 39.0–52.0)
Hemoglobin: 13.4 g/dL (ref 13.0–17.0)
MCH: 27.5 pg (ref 26.0–34.0)
MCHC: 31.6 g/dL (ref 30.0–36.0)
MCV: 86.9 fL (ref 80.0–100.0)
PLATELETS: 259 10*3/uL (ref 150–400)
RBC: 4.88 MIL/uL (ref 4.22–5.81)
RDW: 13.2 % (ref 11.5–15.5)
WBC: 5.1 10*3/uL (ref 4.0–10.5)
nRBC: 0 % (ref 0.0–0.2)

## 2018-04-08 LAB — LIPASE, BLOOD: Lipase: 27 U/L (ref 11–51)

## 2018-04-08 LAB — COMPREHENSIVE METABOLIC PANEL
ALK PHOS: 59 U/L (ref 38–126)
ALT: 28 U/L (ref 0–44)
AST: 22 U/L (ref 15–41)
Albumin: 4.1 g/dL (ref 3.5–5.0)
Anion gap: 10 (ref 5–15)
BUN: 10 mg/dL (ref 6–20)
CALCIUM: 10.1 mg/dL (ref 8.9–10.3)
CO2: 26 mmol/L (ref 22–32)
CREATININE: 1.01 mg/dL (ref 0.61–1.24)
Chloride: 103 mmol/L (ref 98–111)
GFR calc Af Amer: >60 mL/min (ref >60)
GFR calc non Af Amer: >60 mL/min (ref >60)
Glucose, Bld: 100 mg/dL — ABNORMAL HIGH (ref 70–99)
Potassium: 4.2 mmol/L (ref 3.5–5.1)
SODIUM: 139 mmol/L (ref 135–145)
Total Bilirubin: 0.4 mg/dL (ref 0.3–1.2)
Total Protein: 7.1 g/dL (ref 6.5–8.1)

## 2018-04-08 NOTE — ED Triage Notes (Signed)
Pt to ED with c/o mid abd pain x's 2 months. Pt sent to ED ref.abnormal urine test.  Pt denies any nausea or vomiting.  St's has had diarrhea for past 2 days but none today

## 2018-04-08 NOTE — ED Provider Notes (Signed)
[facility] EMERGENCY DEPARTMENT Provider Note   CSN: [id number] Arrival date & time: 04/08/18  2138     History   Chief Complaint Chief Complaint  Patient presents with  . Abdominal Pain    HPI Glen Lambert is a 27 y.o. 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  Patient reports that he was seen earlier this week for suprapubic abdominal pain and had a UA that was concerning for proteinuria, hematuria and hemoglobinuria.  He was advised to follow-up in the ED for further evaluation.  He endorses suprapubic pain that he characterizes as sharp that comes and goes.  He states pain will last for approximately 20 to 30 minutes.  He began 4 days ago.  It is nonradiating.  He reports associated nausea and non-bloody diarrhea for the last 2 days, but none today.  He also endorses urinary frequency.  He denies vomiting, fever, chills, back pain, gross hematuria, hematemesis, melena, hematochezia, penile discharge, penile or testicular pain or swelling.  He states that he previously had an episode of blood in his urine and was treated for UTI with antibiotics.  States the symptoms feel similar.  The history is provided by the patient. No language interpreter was used.    Past Medical History:  Diagnosis Date  . 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 Opioid abuse, in remission ([place])   . Pulmonary embolism [facility])     Patient Active Problem List   Diagnosis Date Noted  . DVT of lower extremity (deep venous thrombosis) ([place]) 04/30/2016  . PE (pulmonary thromboembolism) ([place]) 04/26/2016  . Opioid abuse, in remission ([place]) 04/26/2016  . 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 Tobacco abuse 04/26/2016  . Chest pain 04/26/2016  . Acute pulmonary embolism ([place]) 04/26/2016    Past Surgical History:  Procedure  Laterality Date  . ANKLE SURGERY    . JOINT REPLACEMENT          Home Medications    Prior to Admission medications   Medication Sig Start Date End Date Taking? Authorizing Provider  acetaminophen (TYLENOL) 325 MG tablet Take 2 tablets (650 mg total) by mouth every 4 (four) hours as needed for mild pain, moderate pain, fever or headache. 04/29/16   Elgergawy, Silver Huguenin, MD  cephALEXin (KEFLEX) 500 MG capsule Take 1 capsule (500 mg total) by mouth 2 (two) times daily. 04/09/18   Xochilth Standish A, PA-C  methadone (DOLOPHINE) 10 MG/ML solution Take 100 mg by mouth daily.     [provider]  rivaroxaban (XARELTO) 20 MG TABS tablet Take 1 tablet (20 mg total) by mouth daily with supper. 06/24/16   Charlott Rakes, MD    Family History Family History  Problem Relation Age of Onset  . Pulmonary embolism Maternal Grandfather     Social History Social History   Tobacco Use  . Smoking status: Former Smoker    Packs/day: 0.25    Types: Cigarettes    Last attempt to quit: 03/25/2016    Years since quitting: 2.0  . Smokeless tobacco: Former Systems developer  . Tobacco comment: 1-2 cigs daily  Substance Use Topics  . Alcohol use: Yes    Alcohol/week: 1.0 standard drinks    Types: 1 Cans of beer per week    Comment: Occasional  . Drug use: No    Comment: Hx of Opioid  abuse.      Allergies   Patient has no known allergies.   Review of Systems Review of Systems  Constitutional: Negative for appetite change and fever.  Respiratory: Negative for shortness of breath.   Cardiovascular: Negative for chest pain.  Gastrointestinal: Positive for abdominal pain and nausea. Negative for blood in stool and vomiting.  Genitourinary: Positive for frequency. Negative for dysuria, enuresis, hematuria, penile pain, penile swelling, scrotal swelling and urgency.  Musculoskeletal: Negative for back pain.  Skin: Negative for rash.  Allergic/Immunologic: Negative for immunocompromised state.    Neurological: Negative for headaches.  Psychiatric/Behavioral: Negative for confusion.   Physical Exam Updated Vital Signs BP 105/60   Pulse 73   Temp 98.7 F (37.1 C) (Oral)   Resp 18   Ht 6\' 3"  (1.905 m)   Wt 123.8 kg   SpO2 98%   BMI 34.12 kg/m   Physical Exam  Constitutional: He appears well-developed. No distress.  HENT:  Head: Normocephalic.  Eyes: Conjunctivae are normal.  Neck: Neck supple.  Cardiovascular: Normal rate, regular rhythm, normal heart sounds and intact distal pulses. Exam reveals no gallop and no friction rub.  No murmur heard. Pulmonary/Chest: Effort normal and breath sounds normal. No stridor. No respiratory distress. He has no wheezes. He has no rales. He exhibits no tenderness.  Abdominal: Soft. He exhibits no distension and no mass. There is tenderness. There is no rebound and no guarding. No hernia.  Tender to palpation in the suprapubic region without rebound or guarding.  The remainder of the abdominal exam is unremarkable.  Abdomen is soft, nondistended.  No CVA tenderness bilaterally.   Neurological: He is alert.  Skin: Skin is warm and dry. He is not diaphoretic.  Psychiatric: His behavior is normal.  Nursing note and vitals reviewed.    ED Treatments / Results  Labs (all labs ordered are listed, but only abnormal results are displayed) Labs Reviewed  COMPREHENSIVE METABOLIC PANEL - Abnormal; Notable for the following components:      Result Value   Glucose, Bld 100 (*)    All other components within normal limits  URINALYSIS, ROUTINE W REFLEX MICROSCOPIC - Abnormal; Notable for the following components:   Color, Urine STRAW (*)    Hgb urine dipstick MODERATE (*)    Leukocytes, UA LARGE (*)    Bacteria, UA RARE (*)    All other components within normal limits  URINE CULTURE  LIPASE, BLOOD  CBC    EKG None  Radiology No results found.  Procedures Procedures (including critical care time)  Medications Ordered in  ED Medications - No data to display   Initial Impression / Assessment and Plan / ED Course  I have reviewed the triage vital signs and the nursing notes.  Pertinent labs & imaging results that were available during my care of the patient were reviewed by me and considered in my medical decision making (see chart for details).     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  He had a UA performed earlier this week with hemoglobinuria, hematuria, and proteinuria.  He also endorses nausea, suprapubic abdominal pain, and urinary frequency.  No other associated symptoms.  Hemoglobin is 13.4, increased from 12.3 UA with moderate hemoglobinuria, leukocyte esterase, and few RBCs.  He previously was tested for urethritis with gonorrhea and chlamydia, which were negative.  He is currently in a monogamous relationship with one male  partner.  Doubt STI given his symptoms and will defer testing at this time.  Also doubt spontaneous bleeding since the patient is on Xarelto or obstructive uropathy.  I suspect his symptoms are from cystitis.  We will send urine for culture.  We will treat the patient with Keflex, which is what he was treated for for previous episode; however, urine culture was not sent at that time.  Will provide the patient with a referral to [facility] health and wellness to follow-up to ensure hematuria and hemoglobinuria resolved.  He was given strict return precautions to the emergency department.  He is hemodynamically stable and in no acute distress.  He is safe for discharge home with outpatient follow-up at this time.  Final Clinical Impressions(s) / ED Diagnoses   Final diagnoses:  Acute cystitis without hematuria    ED Discharge Orders         Ordered    cephALEXin (KEFLEX) 500 MG capsule  2 times daily     04/09/18 0055           Joanne Gavel, PA-C 04/09/18 8421    Tegeler,  Gwenyth Allegra, MD 04/09/18 1112

## 2018-04-09 MED ORDER — CEPHALEXIN 500 MG PO CAPS: 500.0000 mg | ORAL_CAPSULE | Freq: Two times a day (BID) | ORAL | 0 refills | Status: DC

## 2018-04-09 NOTE — Discharge Instructions (Addendum)
Thank you for allowing me to care for you today in the Emergency Department.   Take one tablet of Keflex 2 times daily for the next 5 days. Take 1000 mg of Tylenol every 8 hours for pain control. Do not take more than 4000 mg in a 24-hour period.  Follow up with [place] [facility] to have your urine rechecked after you finish your antibiotics to ensure the blood in your urine improved.  Return to the ER if you develop blood or clots in your urine, a high fever, dizziness or lightheadedness, persistent vomiting, or other new, concerning symptoms.

## 2018-04-10 LAB — URINE CULTURE: Culture: <10000 — AB

## 2018-04-21 ENCOUNTER — Ambulatory Visit (HOSPITAL_COMMUNITY)
Admission: RE | Admit: 2018-04-21 | Discharge: 2018-04-21 | Disposition: A | Discharge status: Home / Self Care | Payer: Self-pay | Source: Ambulatory Visit | Attending: Critical Care Medicine | Admitting: Critical Care Medicine

## 2018-04-21 ENCOUNTER — Encounter: Payer: Self-pay | Admitting: Critical Care Medicine

## 2018-04-21 ENCOUNTER — Ambulatory Visit: Payer: Self-pay | Attending: Critical Care Medicine | Admitting: Critical Care Medicine

## 2018-04-21 ENCOUNTER — Other Ambulatory Visit: Payer: Self-pay

## 2018-04-21 VITALS — BP 128/85 | HR 70 | Temp 98.3°F | Resp 20 | Ht 75.0 in | Wt 279.0 lb

## 2018-04-21 DIAGNOSIS — F17210 Nicotine dependence, cigarettes, uncomplicated: Secondary | ICD-10-CM | POA: Insufficient documentation

## 2018-04-21 DIAGNOSIS — F1111 Opioid abuse, in remission: Secondary | ICD-10-CM | POA: Insufficient documentation

## 2018-04-21 DIAGNOSIS — R1030 Lower abdominal pain, unspecified: Secondary | ICD-10-CM | POA: Insufficient documentation

## 2018-04-21 DIAGNOSIS — Z86718 Personal history of other venous thrombosis and embolism: Secondary | ICD-10-CM

## 2018-04-21 DIAGNOSIS — N3001 Acute cystitis with hematuria: Secondary | ICD-10-CM

## 2018-04-21 DIAGNOSIS — N200 Calculus of kidney: Secondary | ICD-10-CM | POA: Insufficient documentation

## 2018-04-21 DIAGNOSIS — Z86711 Personal history of pulmonary embolism: Secondary | ICD-10-CM

## 2018-04-21 LAB — POCT URINALYSIS DIP (CLINITEK)
BILIRUBIN UA: NEGATIVE
GLUCOSE UA: NEGATIVE mg/dL
Ketones, POC UA: NEGATIVE mg/dL
NITRITE UA: NEGATIVE
Spec Grav, UA: 1.025 (ref 1.010–1.025)
UROBILINOGEN UA: 1 U/dL
pH, UA: 6 (ref 5.0–8.0)

## 2018-04-21 NOTE — Progress Notes (Signed)
Subjective:    Patient ID: Glen Lambert, male    DOB: [birth year], 27 y.o.   MRN: [id number]  27 y.o.M with hx of recurrent cystitis .    Rx in ED recently 04/08/18 with Keflex  Abn U/A. Urine Culture was Neg.     Also seen 06/2016 in ED for same  Hx of PE DVT neg hypercoag w/u.  2017  rx for 3 months with xarelto  Off Xarelto since 07/2016.    Hematuria  This is a recurrent (now recurrent pain in abdomen. comes and goes.  Now is worse. ) problem. The current episode started more than 1 year ago. The problem is unchanged. He describes the hematuria as microscopic hematuria. He reports no clotting in his urine stream. His pain is at a severity of 8/10. The pain is moderate. He describes his urine color as tea colored. Irritative symptoms include nocturia. Irritative symptoms do not include frequency or urgency. Obstructive symptoms do not include dribbling, incomplete emptying, an intermittent stream, a slower stream, straining or a weak stream. Associated symptoms include abdominal pain and nausea. Pertinent negatives include no chills, dysuria, facial swelling, fever, flank pain, genital pain, hesitancy, inability to urinate or vomiting. He is sexually active. His past medical history is significant for tobacco use. There is no history of BPH, GU trauma, hypertension, kidney stones, prostatitis, recent infection, sickle cell disease or STDs.   Past Medical History:  Diagnosis Date  . 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 Opioid abuse, in remission ([place])   . Pulmonary embolism ([facility])      Family History  Problem Relation Age of Onset  . Pulmonary embolism Maternal Grandfather      Social History   Socioeconomic History  . Marital status: Single    Spouse name: Not on file  . Number of children: 1  . Years of education: Not on file  . Highest education level: Not on file  Occupational History  . Occupation: delivery truck Consulting civil engineer: [employer]  Social Needs  . Financial  resource strain: Not on file  . Food insecurity:    Worry: Not on file    Inability: Not on file  . Transportation needs:    Medical: Not on file    Non-medical: Not on file  Tobacco Use  . Smoking status: Current Some Day Smoker    Packs/day: 0.25    Types: Cigarettes  . Smokeless tobacco: Former Systems developer  . Tobacco comment: 1-2 cigs daily  Substance and Sexual Activity  . Alcohol use: Yes    Alcohol/week: 1.0 standard drinks    Types: 1 Cans of beer per week    Comment: Occasional  . Drug use: No    Comment: Hx of Opioid abuse.   Marland Kitchen Sexual activity: Yes  Lifestyle  . Physical activity:    Days per week: Not on file    Minutes per session: Not on file  . Stress: Not on file  Relationships  . Social connections:    Talks on phone: Not on file    Gets together: Not on file    Attends religious service: Not on file    Active member of club or organization: Not on file    Attends meetings of clubs or organizations: Not on file    Relationship status: Not on file  . Intimate partner violence:    Fear of current or ex partner: Not on file    Emotionally abused: Not on  file    Physically abused: Not on file    Forced sexual activity: Not on file  Other Topics Concern  . Not on file  Social History Narrative  . Not on file     No Known Allergies   Outpatient Medications Prior to Visit  Medication Sig Dispense Refill  . acetaminophen (TYLENOL) 325 MG tablet Take 2 tablets (650 mg total) by mouth every 4 (four) hours as needed for mild pain, moderate pain, fever or headache. (Patient not taking: Reported on 04/21/2018)    . cephALEXin (KEFLEX) 500 MG capsule Take 1 capsule (500 mg total) by mouth 2 (two) times daily. (Patient not taking: Reported on 04/21/2018) 10 capsule 0  . methadone (DOLOPHINE) 10 MG/ML solution Take 100 mg by mouth daily.     . rivaroxaban (XARELTO) 20 MG TABS tablet Take 1 tablet (20 mg total) by mouth daily with supper. (Patient not taking: Reported on  04/21/2018) 30 tablet 0   No facility-administered medications prior to visit.       Review of Systems  Constitutional: Positive for fatigue. Negative for chills, diaphoresis and fever.  HENT: Negative for facial swelling, nosebleeds, postnasal drip, sinus pressure, sinus pain and sore throat.   Eyes: Negative.   Respiratory: Positive for wheezing. Negative for cough and shortness of breath.   Cardiovascular: Negative.  Negative for chest pain and palpitations.  Gastrointestinal: Positive for abdominal pain, diarrhea and nausea. Negative for blood in stool and vomiting.  Genitourinary: Positive for hematuria and nocturia. Negative for dysuria, flank pain, frequency, hesitancy, incomplete emptying, penile swelling, scrotal swelling, testicular pain and urgency.  Skin: Positive for rash.       Skin rash x two months  Neurological: Negative for dizziness and light-headedness.  Hematological: Negative for adenopathy. Does not bruise/bleed easily.  Psychiatric/Behavioral: Negative.        Objective:   Physical Exam Vitals:   04/21/18 0839  BP: 128/85  Pulse: 70  Resp: 20  Temp: 98.3 F (36.8 C)  TempSrc: Oral  SpO2: 97%  Weight: 279 lb (126.6 kg)  Height: 6\' 3"  (1.905 m)    Gen: Pleasant, well-nourished, in no distress,  normal affect  ENT: No lesions,  mouth clear,  oropharynx clear, no postnasal drip  Neck: No JVD, no TMG, no carotid bruits  Lungs: No use of accessory muscles, no dullness to percussion, clear without rales or rhonchi  Cardiovascular: RRR, heart sounds normal, no murmur or gallops, no peripheral edema  Abdomen: Tender in left flank and also suprapubic area without rebound or guarding, no HSM,  BS normal  Musculoskeletal: No deformities, no cyanosis or clubbing  Neuro: alert, non focal  Skin: Warm, no lesions or rashes  No results found.  Urinalysis is reviewed and shows large amount of blood trace protein, small amount of white cells otherwise  negative  Urine culture was sent  Urinalysis from the emergency room was reviewed and showed similar findings urine culture from the emergency room also was negative  CBC Latest Ref Rng & Units 04/08/2018 07/05/2016 05/18/2016  WBC 4.0 - 10.5 K/uL 5.1 4.2 7.3  Hemoglobin 13.0 - 17.0 g/dL 13.4 12.3(L) 11.5(L)  Hematocrit 39.0 - 52.0 % 42.4 37.1(L) 34.4(L)  Platelets 150 - 400 K/uL 259 233 277   BMP Latest Ref Rng & Units 04/08/2018 07/05/2016 05/18/2016  Glucose 70 - 99 mg/dL 100(H) 116(H) 102(H)  BUN 6 - 20 mg/dL 10 13 10   Creatinine 0.61 - 1.24 mg/dL 1.01 0.94 0.77  Sodium  135 - 145 mmol/L 139 138 136  Potassium 3.5 - 5.1 mmol/L 4.2 3.8 4.0  Chloride 98 - 111 mmol/L 103 102 104  CO2 22 - 32 mmol/L 26 24 23   Calcium 8.9 - 10.3 mg/dL 10.1 9.5 9.3        Assessment & Plan:  I personally reviewed all images and lab data in the [facility] system as well as any outside material available during this office visit and agree with the  radiology impressions.   Acute cystitis with hematuria Cystitis with associated left flank pain and hematuria with increased protein in the urine as well in association with normal creatinine  Need to evaluate for stone disease  Plan Repeat urine culture Obtain CT scan of abdomen and pelvis without contrast using renal stone study Referral to urology  History of pulmonary embolism History of pulmonary embolism now resolved  History of deep venous thrombosis (DVT) of distal vein of left lower extremity History of deep venous thrombosis now resolved  Lower abdominal pain Lower suprapubic pain Likely related to cystitis of unknown etiology  Follow-up urine culture Off antibiotics for now Referral to urology Follow-up CT scan of pelvis and abdomen   Diagnoses and all orders for this visit:  Acute cystitis with hematuria -     POCT URINALYSIS DIP (CLINITEK) -     Urine Culture -     Ambulatory referral to Urology  Lower abdominal pain -      Cancel: CT Abdomen Pelvis Wo Contrast; Future -     Ambulatory referral to Urology -     CT Abdomen Pelvis Wo Contrast; Future  History of pulmonary embolism  History of deep venous thrombosis (DVT) of distal vein of left lower extremity

## 2018-04-21 NOTE — Assessment & Plan Note (Signed)
History of deep venous thrombosis now resolved

## 2018-04-21 NOTE — Assessment & Plan Note (Signed)
Lower suprapubic pain Likely related to cystitis of unknown etiology  Follow-up urine culture Off antibiotics for now Referral to urology Follow-up CT scan of pelvis and abdomen

## 2018-04-21 NOTE — Assessment & Plan Note (Signed)
Cystitis with associated left flank pain and hematuria with increased protein in the urine as well in association with normal creatinine  Need to evaluate for stone disease  Plan Repeat urine culture Obtain CT scan of abdomen and pelvis without contrast using renal stone study Referral to urology

## 2018-04-21 NOTE — Progress Notes (Signed)
CC: HFU- abdominal pain  PAIN:abdomen

## 2018-04-21 NOTE — Patient Instructions (Signed)
A CT scan of the abdomen will be obtained A referral to urology will be obtained

## 2018-04-21 NOTE — Assessment & Plan Note (Signed)
History of pulmonary embolism now resolved

## 2018-04-22 ENCOUNTER — Encounter: Payer: Self-pay | Admitting: Critical Care Medicine

## 2018-04-22 DIAGNOSIS — N200 Calculus of kidney: Secondary | ICD-10-CM

## 2018-04-22 NOTE — Progress Notes (Signed)
Abd pelvic CT Scan reviewed from 10/30  IMPRESSION: 1. Large round calculus in the LEFT renal pelvis at the ureteropelvic junction. No clear evidence of obstruction. 2. No additional calculi in the kidneys or ureters or bladder.    Pt made aware he has renal stone disease.  He was made a urology referral.  Asencion Noble

## 2018-04-23 ENCOUNTER — Telehealth: Payer: Self-pay | Admitting: Family Medicine

## 2018-04-23 LAB — URINE CULTURE

## 2018-04-23 NOTE — Telephone Encounter (Signed)
Pt with a kidney stone and  request medication for pain. He states usually he takes Tramadol for pain.

## 2018-04-23 NOTE — Telephone Encounter (Signed)
Patient called stating he has kidney stones and since by Dr. Joya Gaskins on 10/30. Patient states he is in pain and would like to be prescribed something. Please f/u with patient.

## 2018-04-26 ENCOUNTER — Telehealth: Payer: Self-pay | Admitting: Family Medicine

## 2018-04-26 ENCOUNTER — Other Ambulatory Visit: Payer: Self-pay | Admitting: Critical Care Medicine

## 2018-04-26 ENCOUNTER — Other Ambulatory Visit: Payer: Self-pay | Admitting: Family Medicine

## 2018-04-26 DIAGNOSIS — N200 Calculus of kidney: Secondary | ICD-10-CM

## 2018-04-26 NOTE — Telephone Encounter (Signed)
He is currently on methadone and so I am unable to give him tramadol.  I can write an NSAID for him.

## 2018-04-26 NOTE — Progress Notes (Signed)
Urology referral for renal calculus ordered  Glen Lambert

## 2018-04-26 NOTE — Telephone Encounter (Signed)
Patient called to get an update regarding pain medication. Please follow up with patient.

## 2018-04-26 NOTE — Telephone Encounter (Signed)
Patient aware of message per Dr Margarita Rana. Pt agrees to come in to office for Toradol injection. Instructed to register for nurse visit upon arrival.He verbalized understanding.  VORB: Toradol 60 mg IM once.

## 2018-07-24 HISTORY — PX: CYSTOSCOPY W/ URETERAL STENT PLACEMENT: SHX1429

## 2018-08-22 ENCOUNTER — Emergency Department (HOSPITAL_COMMUNITY): Payer: Medicaid - Out of State

## 2018-08-22 ENCOUNTER — Other Ambulatory Visit: Payer: Self-pay

## 2018-08-22 ENCOUNTER — Encounter (HOSPITAL_COMMUNITY): Payer: Self-pay | Admitting: *Deleted

## 2018-08-22 ENCOUNTER — Inpatient Hospital Stay (HOSPITAL_COMMUNITY)
Admission: EM | Admit: 2018-08-22 | Discharge: 2018-08-25 | DRG: 659 | Disposition: A | Discharge status: Home / Self Care | Payer: Medicaid - Out of State | Attending: Internal Medicine | Admitting: Internal Medicine

## 2018-08-22 DIAGNOSIS — T83192A Other mechanical complication of urinary stent, initial encounter: Principal | ICD-10-CM | POA: Diagnosis present

## 2018-08-22 DIAGNOSIS — R109 Unspecified abdominal pain: Secondary | ICD-10-CM

## 2018-08-22 DIAGNOSIS — Z86711 Personal history of pulmonary embolism: Secondary | ICD-10-CM

## 2018-08-22 DIAGNOSIS — D62 Acute posthemorrhagic anemia: Secondary | ICD-10-CM | POA: Diagnosis present

## 2018-08-22 DIAGNOSIS — F1111 Opioid abuse, in remission: Secondary | ICD-10-CM | POA: Diagnosis present

## 2018-08-22 DIAGNOSIS — F1123 Opioid dependence with withdrawal: Secondary | ICD-10-CM

## 2018-08-22 DIAGNOSIS — D649 Anemia, unspecified: Secondary | ICD-10-CM | POA: Diagnosis present

## 2018-08-22 DIAGNOSIS — N179 Acute kidney failure, unspecified: Secondary | ICD-10-CM | POA: Diagnosis present

## 2018-08-22 DIAGNOSIS — R310 Gross hematuria: Secondary | ICD-10-CM | POA: Diagnosis present

## 2018-08-22 DIAGNOSIS — Z86718 Personal history of other venous thrombosis and embolism: Secondary | ICD-10-CM

## 2018-08-22 DIAGNOSIS — N1330 Unspecified hydronephrosis: Secondary | ICD-10-CM | POA: Diagnosis present

## 2018-08-22 DIAGNOSIS — R319 Hematuria, unspecified: Secondary | ICD-10-CM

## 2018-08-22 DIAGNOSIS — Z9889 Other specified postprocedural states: Secondary | ICD-10-CM

## 2018-08-22 DIAGNOSIS — F1193 Opioid use, unspecified with withdrawal: Secondary | ICD-10-CM

## 2018-08-22 DIAGNOSIS — D473 Essential (hemorrhagic) thrombocythemia: Secondary | ICD-10-CM

## 2018-08-22 DIAGNOSIS — N136 Pyonephrosis: Secondary | ICD-10-CM | POA: Diagnosis present

## 2018-08-22 DIAGNOSIS — Y838 Other surgical procedures as the cause of abnormal reaction of the patient, or of later complication, without mention of misadventure at the time of the procedure: Secondary | ICD-10-CM | POA: Diagnosis present

## 2018-08-22 DIAGNOSIS — D75839 Thrombocytosis, unspecified: Secondary | ICD-10-CM | POA: Diagnosis present

## 2018-08-22 DIAGNOSIS — N200 Calculus of kidney: Secondary | ICD-10-CM

## 2018-08-22 DIAGNOSIS — F17210 Nicotine dependence, cigarettes, uncomplicated: Secondary | ICD-10-CM | POA: Diagnosis present

## 2018-08-22 DIAGNOSIS — N390 Urinary tract infection, site not specified: Secondary | ICD-10-CM

## 2018-08-22 DIAGNOSIS — A419 Sepsis, unspecified organism: Secondary | ICD-10-CM | POA: Diagnosis present

## 2018-08-22 LAB — CBC WITH DIFFERENTIAL/PLATELET
ABS IMMATURE GRANULOCYTES: 0.05 10*3/uL (ref 0.00–0.07)
BASOS PCT: 0 %
Basophils Absolute: 0 10*3/uL (ref 0.0–0.1)
EOS PCT: 1 %
Eosinophils Absolute: 0.2 10*3/uL (ref 0.0–0.5)
HCT: 36.5 % — ABNORMAL LOW (ref 39.0–52.0)
Hemoglobin: 11.4 g/dL — ABNORMAL LOW (ref 13.0–17.0)
Immature Granulocytes: 0 %
Lymphocytes Relative: 17 %
Lymphs Abs: 2.1 10*3/uL (ref 0.7–4.0)
MCH: 27.7 pg (ref 26.0–34.0)
MCHC: 31.2 g/dL (ref 30.0–36.0)
MCV: 88.8 fL (ref 80.0–100.0)
MONO ABS: 0.5 10*3/uL (ref 0.1–1.0)
MONOS PCT: 4 %
NEUTROS ABS: 9.4 10*3/uL — AB (ref 1.7–7.7)
Neutrophils Relative %: 78 %
PLATELETS: 483 10*3/uL — AB (ref 150–400)
RBC: 4.11 MIL/uL — AB (ref 4.22–5.81)
RDW: 13.2 % (ref 11.5–15.5)
WBC: 12.3 10*3/uL — ABNORMAL HIGH (ref 4.0–10.5)
nRBC: 0 % (ref 0.0–0.2)

## 2018-08-22 LAB — URINALYSIS, ROUTINE W REFLEX MICROSCOPIC
BILIRUBIN URINE: NEGATIVE
GLUCOSE, UA: NEGATIVE mg/dL
Ketones, ur: NEGATIVE mg/dL
NITRITE: NEGATIVE
Protein, ur: 100 mg/dL — AB
RBC / HPF: >50 RBC/hpf — ABNORMAL HIGH (ref 0–5)
SPECIFIC GRAVITY, URINE: 1.027 (ref 1.005–1.030)
pH: 5 (ref 5.0–8.0)

## 2018-08-22 LAB — APTT: aPTT: 34 seconds (ref 24–36)

## 2018-08-22 LAB — LACTIC ACID, PLASMA: LACTIC ACID, VENOUS: 1.4 mmol/L (ref 0.5–1.9)

## 2018-08-22 LAB — BASIC METABOLIC PANEL
Anion gap: 13 (ref 5–15)
BUN: 16 mg/dL (ref 6–20)
CHLORIDE: 104 mmol/L (ref 98–111)
CO2: 23 mmol/L (ref 22–32)
CREATININE: 1.28 mg/dL — AB (ref 0.61–1.24)
Calcium: 9.4 mg/dL (ref 8.9–10.3)
GFR calc Af Amer: >60 mL/min (ref >60)
GLUCOSE: 88 mg/dL (ref 70–99)
POTASSIUM: 3.6 mmol/L (ref 3.5–5.1)
SODIUM: 140 mmol/L (ref 135–145)

## 2018-08-22 LAB — PROTIME-INR
INR: 1.2 (ref 0.8–1.2)
Prothrombin Time: 14.8 seconds (ref 11.4–15.2)

## 2018-08-22 MED ORDER — SODIUM CHLORIDE 0.9 % IV BOLUS (SEPSIS)
2000.0000 mL | Freq: Once | INTRAVENOUS | Status: AC
  Administered 2018-08-22: 1000 mL via INTRAVENOUS

## 2018-08-22 MED ORDER — ACETAMINOPHEN 325 MG PO TABS
650.0000 mg | ORAL_TABLET | Freq: Four times a day (QID) | ORAL | Status: DC | PRN
  Administered 2018-08-22: 650 mg via ORAL
  Filled 2018-08-22: qty 2

## 2018-08-22 MED ORDER — MORPHINE SULFATE (PF) 4 MG/ML IV SOLN
4.0000 mg | INTRAVENOUS | Status: DC | PRN
  Administered 2018-08-22 – 2018-08-23 (×2): 4 mg via INTRAVENOUS
  Filled 2018-08-22 (×2): qty 1

## 2018-08-22 MED ORDER — SODIUM CHLORIDE 0.9 % IV SOLN: 2.0000 g | Freq: Two times a day (BID) | INTRAVENOUS | Status: DC

## 2018-08-22 MED ORDER — FENTANYL CITRATE (PF) 100 MCG/2ML IJ SOLN
50.0000 ug | Freq: Once | INTRAMUSCULAR | Status: AC
  Administered 2018-08-22: 50 ug via INTRAVENOUS
  Filled 2018-08-22: qty 2

## 2018-08-22 MED ORDER — SODIUM CHLORIDE 0.9 % IV BOLUS
1000.0000 mL | Freq: Once | INTRAVENOUS | Status: AC
  Administered 2018-08-22: 1000 mL via INTRAVENOUS

## 2018-08-22 MED ORDER — SENNOSIDES-DOCUSATE SODIUM 8.6-50 MG PO TABS: 1.0000 | ORAL_TABLET | Freq: Every evening | ORAL | Status: DC | PRN

## 2018-08-22 MED ORDER — SODIUM CHLORIDE 0.9 % IV SOLN
2.0000 g | Freq: Once | INTRAVENOUS | Status: DC
  Administered 2018-08-22: 2 g via INTRAVENOUS
  Filled 2018-08-22: qty 20

## 2018-08-22 MED ORDER — HYDROCODONE-ACETAMINOPHEN 5-325 MG PO TABS
1.0000 | ORAL_TABLET | ORAL | Status: DC | PRN
  Administered 2018-08-23: 1 via ORAL
  Administered 2018-08-24: 2 via ORAL
  Administered 2018-08-24: 1 via ORAL
  Administered 2018-08-25: 2 via ORAL
  Filled 2018-08-22 (×2): qty 1
  Filled 2018-08-22 (×2): qty 2

## 2018-08-22 MED ORDER — ONDANSETRON HCL 4 MG/2ML IJ SOLN
4.0000 mg | Freq: Four times a day (QID) | INTRAMUSCULAR | Status: DC | PRN
  Administered 2018-08-23: 4 mg via INTRAVENOUS

## 2018-08-22 MED ORDER — ACETAMINOPHEN 650 MG RE SUPP: 650.0000 mg | Freq: Four times a day (QID) | RECTAL | Status: DC | PRN

## 2018-08-22 MED ORDER — ONDANSETRON HCL 4 MG PO TABS: 4.0000 mg | ORAL_TABLET | Freq: Four times a day (QID) | ORAL | Status: DC | PRN

## 2018-08-22 MED ORDER — MORPHINE SULFATE (PF) 4 MG/ML IV SOLN
4.0000 mg | Freq: Once | INTRAVENOUS | Status: AC
  Administered 2018-08-22: 4 mg via INTRAVENOUS
  Filled 2018-08-22: qty 1

## 2018-08-22 MED ORDER — SODIUM CHLORIDE 0.9 % IV SOLN
2.0000 g | Freq: Two times a day (BID) | INTRAVENOUS | Status: DC
  Administered 2018-08-22 – 2018-08-23 (×3): 2 g via INTRAVENOUS
  Filled 2018-08-22 (×4): qty 2

## 2018-08-22 MED ORDER — SODIUM CHLORIDE 0.9% FLUSH
3.0000 mL | Freq: Two times a day (BID) | INTRAVENOUS | Status: DC
  Administered 2018-08-23 – 2018-08-25 (×3): 3 mL via INTRAVENOUS

## 2018-08-22 NOTE — Progress Notes (Signed)
Pharmacy Antibiotic Note  Glen Lambert is a 28 y.o. male admitted on 08/22/2018 with UTI.  Pharmacy has been consulted for cefepime dosing. Patient c/o flank pain with recent instrumentation.   Plan: Cefepime 2 gm IV q12h Monitor for clinical course, C&S, and LOT  Height: 6\' 2"  (188 cm) Weight: 240 lb (108.9 kg) IBW/kg (Calculated) : 82.2  Temp (24hrs), Avg:98.9 F (37.2 C), Min:98.9 F (37.2 C), Max:98.9 F (37.2 C)  Recent Labs  Lab 08/22/18 1810  WBC 12.3*  CREATININE 1.28*    Estimated Creatinine Clearance: 112.9 mL/min (A) (by C-G formula based on SCr of 1.28 mg/dL (H)).    No Known Allergies  Malva Diesing A. Levada Dy, PharmD, [place] Please utilize Amion for appropriate phone number to reach the unit pharmacist ([place])    08/22/2018 8:55 PM

## 2018-08-22 NOTE — ED Notes (Signed)
Rocephin not started yet due to need for blood cultures.

## 2018-08-22 NOTE — ED Notes (Addendum)
Lab drawing blood at this time.  To go to the floor when they are finished.  1st liter of 2072ml bolus started.  Still needs the second liter.  Sent up with patient.

## 2018-08-22 NOTE — ED Triage Notes (Signed)
Stent placed at [facility] 06-30-2018 . Pt reports he was supposed to go back in 7 days to have it removed . No reason given why Pt did not return to have stent removed.

## 2018-08-22 NOTE — ED Provider Notes (Addendum)
[facility] EMERGENCY DEPARTMENT Provider Note   CSN: [id number] Arrival date & time: 08/22/18  1631    History   Chief Complaint Chief Complaint  Patient presents with  . Flank Pain    HPI Glen Lambert is a 28 y.o. male.     HPI  28 year old male presents today complaining of left flank pain.  He reports that on February 7 he had a stent placed in his left ureter and removal of kidney stones at [facility].  He states that since that time he has continued to have increasing left flank pain.  He initially had relief of pain.  Approximately 1 week after the procedure he was supposed to pull the stent but states that he was unable to do so.  He has had increased left-sided pain that he describes as radiating from the left mid back area down into the groin region similar to previous kidney stone pain.  It has been present most of the time.  It is worse with unknown aggravating factors and it is improved with no specific interventions.  He said several days ago he had some fever and chills.  These have resolved spontaneously.  Past Medical History:  Diagnosis Date  . 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 Opioid abuse, in remission ([place])   . Pulmonary embolism [facility])     Patient Active Problem List   Diagnosis Date Noted  . Lower abdominal pain 04/21/2018  . Left Renal calculus 04/21/2018  . History of deep venous thrombosis (DVT) of distal vein of left lower extremity 04/30/2016  . History of pulmonary embolism 04/26/2016  . Opioid abuse, in remission ([place]) 04/26/2016  . 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 Tobacco abuse 04/26/2016    Past Surgical History:  Procedure Laterality Date  . ANKLE SURGERY    . JOINT REPLACEMENT          Home Medications    Prior to Admission medications   Medication Sig Start Date End Date Taking? Authorizing Provider  methadone (DOLOPHINE) 10 MG/ML solution Take 100 mg by mouth daily.      [provider]    Family History Family History  Problem Relation Age of Onset  . Pulmonary embolism Maternal Grandfather     Social History Social History   Tobacco Use  . Smoking status: Current Some Day Smoker    Packs/day: 0.25    Types: Cigarettes  . Smokeless tobacco: Former Systems developer  . Tobacco comment: 1-2 cigs daily  Substance Use Topics  . Alcohol use: Yes    Alcohol/week: 1.0 standard drinks    Types: 1 Cans of beer per week    Comment: Occasional  . Drug use: No    Comment: Hx of Opioid abuse.      Allergies   Patient has no known allergies.   Review of Systems Review of Systems  All other systems reviewed and are negative.    Physical Exam Updated Vital Signs BP (!) 152/88   Pulse (!) 103   Temp 98.9 F (37.2 C) (Oral)   Resp (!) 29   Ht 1.88 m (6\' 2" )   Wt 108.9 kg   SpO2 98%   BMI 30.81 kg/m   Physical Exam Vitals signs and nursing note reviewed.  Constitutional:      Appearance: He is normal weight.  HENT:     Head: Normocephalic.     Right Ear: External ear normal.     Left Ear: External  ear normal.     Nose: Nose normal.     Mouth/Throat:     Mouth: Mucous membranes are moist.  Eyes:     Pupils: Pupils are equal, round, and reactive to light.  Neck:     Musculoskeletal: Normal range of motion.  Cardiovascular:     Rate and Rhythm: Tachycardia present.     Pulses: Normal pulses.  Pulmonary:     Effort: Pulmonary effort is normal.  Abdominal:     General: Abdomen is flat.  Musculoskeletal: Normal range of motion.  Skin:    General: Skin is warm.     Capillary Refill: Capillary refill takes less than 2 seconds.  Neurological:     General: No focal deficit present.     Mental Status: He is alert. Mental status is at baseline.  Psychiatric:        Mood and Affect: Mood normal.      ED Treatments / Results  Labs (all labs ordered are listed, but only abnormal results are displayed) Labs Reviewed  URINALYSIS,  ROUTINE W REFLEX MICROSCOPIC  CBC WITH DIFFERENTIAL/PLATELET  BASIC METABOLIC PANEL    EKG None  Radiology Ct Renal Stone Study  Result Date: 08/22/2018 CLINICAL DATA:  Left flank pain. Recent left kidney stone removal with stent placement. EXAM: CT ABDOMEN AND PELVIS WITHOUT CONTRAST TECHNIQUE: Multidetector CT imaging of the abdomen and pelvis was performed following the standard protocol without IV contrast. COMPARISON:  04/21/2018 . FINDINGS: Lower chest: Right lung base appears clear. The left lung base is not well visualized secondary to asymmetric elevation of left hemidiaphragm. Hepatobiliary: Within the limitations of unenhanced technique there is no focal liver abnormality identified. Gallbladder unremarkable. No biliary dilatation. Pancreas: Unremarkable. No pancreatic ductal dilatation or surrounding inflammatory changes. Spleen: The spleen is displaced into the left upper quadrant of the abdomen and is only partially visualized. The visualized portions are unremarkable. Adrenals/Urinary Tract: Normal appearance of the adrenal glands. The right kidney is unremarkable. No right kidney stone or hydronephrosis. There is a left-sided nephroureteral stent in place with. Tiny stone fragments are suspected within the mid left ureter at the L4 level, image 78/9. Large left-sided subcapsular fluid collection is identified measuring 8.9 by 5.3 by 12.8 cm, image 55/8. This measures water density. There is extension of this fluid collection into the retroperitoneum. Here, there is a large, water density fluid collection measuring 8.2 by 6.7 by 17.8 cm (volume = 510 cm^3). This fluid collection extends into the left psoas muscle which is asymmetrically enlarged. Urinary bladder appears normal. Stomach/Bowel: Stomach is within normal limits. Appendix appears normal. No evidence of bowel wall thickening, distention, or inflammatory changes. Vascular/Lymphatic: No significant vascular findings are present. No  enlarged abdominal or pelvic lymph nodes. Reproductive: Prostate is unremarkable. Other: No abdominal wall hernia or abnormality. No abdominopelvic ascites. Musculoskeletal: No acute or significant osseous findings. IMPRESSION: 1. There is a large low-density subcapsular fluid collection involving the left kidney. This may represent either a large urinoma or chronic hematoma. The fluid collection extends into the left retroperitoneum where there is an approximately 510 cc fluid collection. The retroperitoneal fluid collection is also low in attenuation and may represent either a urinoma or chronic hematoma. 2. Left-sided nephroureteral stent is in place. There is left-sided hydronephrosis. Along the course of the mid left ureter there are several filling defects adjacent to the stent which may represent tiny stone fragments. Electronically Signed   By: Kerby Moors M.D.   On: 08/22/2018 18:00  Procedures Procedures (including critical care time)  Medications Ordered in ED Medications  sodium chloride 0.9 % bolus 1,000 mL (1,000 mLs Intravenous New Bag/Given 08/22/18 1809)  fentaNYL (SUBLIMAZE) injection 50 mcg (50 mcg Intravenous Given 08/22/18 1810)     Initial Impression / Assessment and Plan / ED Course  I have reviewed the triage vital signs and the nursing notes.  Pertinent labs & imaging results that were available during my care of the patient were reviewed by me and considered in my medical decision making (see chart for details).    Discussed with Dr.Herrick, urinalysis and ct reviewed Patient with mild tachycardia and continued pain [facility] at [facility] paged     Discussed with Dr. Myna Hidalgo and will see for admission  1- left sided subcapsular fluid collection 2- uti 3 s/p renal stent 4- elevated creatinine  Discussed with Dr Clydene Laming at [facility] and advises patient did have some extravasation in the upper pole the left kidney during the recent stent placement.  He feels that this increases  the probability of Neri fluid collection and advises to Consider IR  He advises they are available for consult. Final Clinical Impressions(s) / ED Diagnoses   Final diagnoses:  Left flank pain  Urinary tract infection with hematuria, site unspecified    ED Discharge Orders    None       Pattricia Boss, MD 08/22/18 2029    Pattricia Boss, MD 08/23/18 1501

## 2018-08-22 NOTE — ED Notes (Signed)
Facesheet faxed to [facility] 

## 2018-08-22 NOTE — ED Notes (Signed)
Delay in lab draw, admitting MD at bedside.

## 2018-08-22 NOTE — ED Triage Notes (Signed)
PT absent from room for stone study unable to complete triage.

## 2018-08-22 NOTE — ED Triage Notes (Signed)
PT presents with Lt side flank pain . Pt reports he had a renal stent placed at [place] in Jan.2020

## 2018-08-22 NOTE — Consult Note (Signed)
Patient s/p staged ureteroscopy in [facility] for 36mm proximal stone on Feb 2.  He had a stent placed 3 weeks prior for a stenotic/tight ureter.  The case did not some extravasation of contrast in the upper pole at the time of the stent placement.  The stent tether was left on and shortened so the stent could be removed by the patient 1 week after surgery.  The stent remains in today, because the patient couldn't find the tether.  He has not followed-up with his primary urologists.   The patient was struggling with poorly controlled pain post-op and thus presented to the ED.  He also was stating that he had low grade fevers yesterday.    A CT scan shows proximal hydronephrosis (likely residual for long-standing obstructing proximal stone) with a large fluid collection on the posterior aspect of the kidney. He had a slight elevation in his WBC and his kidney function was very mildly elevated.  His urine did not look infected.   He is being admitted for hydration and pain control.  There is no further intervention warranted from a urologic prospective.  The stent should be left in place and the patient needs to follow-up with [facility] urology.  I will touch base with this patient in the morning.

## 2018-08-22 NOTE — H&P (Signed)
History and Physical    Glen Lambert KGY:[id number] DOB: 15-Jun-1991 DOA: 08/22/2018  PCP: Charlott Rakes, MD   Patient coming from: Home   Chief Complaint: Left flank pain, fever/chills   HPI: Glen Lambert is a 28 y.o. male with medical history significant for opioid dependence, nephrolithiasis, and left nephroureteral stent placement in January, replaced in February, and now presenting to the emergency department for evaluation of left flank pain, subjective fevers, and chills.  Patient reports some left flank pain ever since January, but reports that this is worsened significantly over the past week and he has also been experiencing subjective fevers and chills over the same interval.  He has severe pain localized to the left flank, mild dysuria, gross hematuria, no shortness of breath or significant cough, and no diarrhea.  He reports nausea with several episodes of nonbloody vomiting associated with this.  ED Course: Upon arrival to the ED, patient is found to be afebrile, saturating well on room air, tachypneic, slightly tachycardic, and with stable blood pressure.  Chemistry panel is notable for creatinine 1.28, up from 0.81 in December.  CBC is notable for leukocytosis to 12,300, mild normocytic anemia, and thrombocytosis.  Blood and urine cultures were ordered, 1 L of normal saline administered, and the patient was treated with fentanyl, morphine, and Rocephin in the ED.  Urology at [facility] was consulted by the ED physician and recommended outpatient follow-up.  Our on-call urologist was also consulted by the ED physician and did not feel that the patient required any emergent procedure.  Hospitalist were asked to admit for ongoing evaluation and management of possible sepsis secondary to UTI complicated by nephrolithiasis status post stenting.  Review of Systems:  All other systems reviewed and apart from HPI, are negative.  Past Medical History:  Diagnosis Date  . 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 Opioid abuse, in remission ([place])   . Pulmonary embolism [facility])     Past Surgical History:  Procedure Laterality Date  . ANKLE SURGERY    . JOINT REPLACEMENT       reports that he has been smoking cigarettes. He has been smoking about 0.25 packs per day. He has quit using smokeless tobacco. He reports current alcohol use of about 1.0 standard drinks of alcohol per week. He reports that he does not use drugs.  No Known Allergies  Family History  Problem Relation Age of Onset  . Pulmonary embolism Maternal Grandfather      Prior to Admission medications   Medication Sig Start Date End Date Taking? Authorizing Provider  methadone (DOLOPHINE) 10 MG/ML solution Take 100 mg by mouth daily.     [provider]    Physical Exam: Vitals:   08/22/18 1830 08/22/18 1845 08/22/18 1900 08/22/18 1915  BP: (!) 151/93 (!) 141/90 (!) 146/78 (!) 153/96  Pulse: (!) 105 90 91 (!) 102  Resp:      Temp:      TempSrc:      SpO2: 99% 97% 97% 97%  Weight:      Height:        Constitutional: NAD, appears uncomfortable  Eyes: PERTLA, lids and conjunctivae normal ENMT: Mucous membranes are moist. Posterior pharynx clear of any exudate or lesions.   Neck: normal, supple, no masses, no thyromegaly Respiratory: clear to auscultation bilaterally, no wheezing, no crackles. Normal respiratory effort.   Cardiovascular: Rate ~110 and regular. No extremity edema.  Abdomen: No distension, soft, left flank tender. Bowel sounds active.  Musculoskeletal: no clubbing / cyanosis. No joint deformity upper and lower extremities.   Skin: no significant rashes, lesions, ulcers. Warm, dry, well-perfused. Neurologic: CN 2-12 grossly intact. Sensation intact. Moving all extremities.  Psychiatric: Alert and oriented x 3. Calm, cooperative.    Labs on Admission: I have personally reviewed following labs and imaging studies  CBC: Recent Labs  Lab 08/22/18 1810  WBC 12.3*    NEUTROABS 9.4*  HGB 11.4*  HCT 36.5*  MCV 88.8  PLT 202*   Basic Metabolic Panel: Recent Labs  Lab 08/22/18 1810  NA 140  K 3.6  CL 104  CO2 23  GLUCOSE 88  BUN 16  CREATININE 1.28*  CALCIUM 9.4   GFR: Estimated Creatinine Clearance: 112.9 mL/min (A) (by C-G formula based on SCr of 1.28 mg/dL (H)). Liver Function Tests: No results for input(s): AST, ALT, ALKPHOS, BILITOT, PROT, ALBUMIN in the last 168 hours. No results for input(s): LIPASE, AMYLASE in the last 168 hours. No results for input(s): AMMONIA in the last 168 hours. Coagulation Profile: No results for input(s): INR, PROTIME in the last 168 hours. Cardiac Enzymes: No results for input(s): CKTOTAL, CKMB, CKMBINDEX, TROPONINI in the last 168 hours. BNP (last 3 results) No results for input(s): PROBNP in the last 8760 hours. HbA1C: No results for input(s): HGBA1C in the last 72 hours. CBG: No results for input(s): GLUCAP in the last 168 hours. Lipid Profile: No results for input(s): CHOL, HDL, LDLCALC, TRIG, CHOLHDL, LDLDIRECT in the last 72 hours. Thyroid Function Tests: No results for input(s): TSH, T4TOTAL, FREET4, T3FREE, THYROIDAB in the last 72 hours. Anemia Panel: No results for input(s): VITAMINB12, FOLATE, FERRITIN, TIBC, IRON, RETICCTPCT in the last 72 hours. Urine analysis:    Component Value Date/Time   COLORURINE AMBER (A) 08/22/2018 1755   APPEARANCEUR HAZY (A) 08/22/2018 1755   LABSPEC 1.027 08/22/2018 1755   PHURINE 5.0 08/22/2018 1755   GLUCOSEU NEGATIVE 08/22/2018 1755   HGBUR LARGE (A) 08/22/2018 1755   BILIRUBINUR NEGATIVE 08/22/2018 1755   BILIRUBINUR negative 04/21/2018 0858   KETONESUR NEGATIVE 08/22/2018 1755   PROTEINUR 100 (A) 08/22/2018 1755   UROBILINOGEN 1.0 04/21/2018 0858   NITRITE NEGATIVE 08/22/2018 1755   LEUKOCYTESUR LARGE (A) 08/22/2018 1755   Sepsis Labs: @LABRCNTIP (procalcitonin:4,lacticidven:4) )No results found for this or any previous visit (from the past 240  hour(s)).   Radiological Exams on Admission: Ct Renal Stone Study  Result Date: 08/22/2018 CLINICAL DATA:  Left flank pain. Recent left kidney stone removal with stent placement. EXAM: CT ABDOMEN AND PELVIS WITHOUT CONTRAST TECHNIQUE: Multidetector CT imaging of the abdomen and pelvis was performed following the standard protocol without IV contrast. COMPARISON:  04/21/2018 . FINDINGS: Lower chest: Right lung base appears clear. The left lung base is not well visualized secondary to asymmetric elevation of left hemidiaphragm. Hepatobiliary: Within the limitations of unenhanced technique there is no focal liver abnormality identified. Gallbladder unremarkable. No biliary dilatation. Pancreas: Unremarkable. No pancreatic ductal dilatation or surrounding inflammatory changes. Spleen: The spleen is displaced into the left upper quadrant of the abdomen and is only partially visualized. The visualized portions are unremarkable. Adrenals/Urinary Tract: Normal appearance of the adrenal glands. The right kidney is unremarkable. No right kidney stone or hydronephrosis. There is a left-sided nephroureteral stent in place with. Tiny stone fragments are suspected within the mid left ureter at the L4 level, image 78/9. Large left-sided subcapsular fluid collection is identified measuring 8.9 by 5.3 by 12.8 cm, image 55/8. This measures water density.  There is extension of this fluid collection into the retroperitoneum. Here, there is a large, water density fluid collection measuring 8.2 by 6.7 by 17.8 cm (volume = 510 cm^3). This fluid collection extends into the left psoas muscle which is asymmetrically enlarged. Urinary bladder appears normal. Stomach/Bowel: Stomach is within normal limits. Appendix appears normal. No evidence of bowel wall thickening, distention, or inflammatory changes. Vascular/Lymphatic: No significant vascular findings are present. No enlarged abdominal or pelvic lymph nodes. Reproductive: Prostate is  unremarkable. Other: No abdominal wall hernia or abnormality. No abdominopelvic ascites. Musculoskeletal: No acute or significant osseous findings. IMPRESSION: 1. There is a large low-density subcapsular fluid collection involving the left kidney. This may represent either a large urinoma or chronic hematoma. The fluid collection extends into the left retroperitoneum where there is an approximately 510 cc fluid collection. The retroperitoneal fluid collection is also low in attenuation and may represent either a urinoma or chronic hematoma. 2. Left-sided nephroureteral stent is in place. There is left-sided hydronephrosis. Along the course of the mid left ureter there are several filling defects adjacent to the stent which may represent tiny stone fragments. Electronically Signed   By: Kerby Moors M.D.   On: 08/22/2018 18:00    EKG: Not performed.   Assessment/Plan   1. Sepsis secondary to UTI; left nephrolithiasis s/p stent; subcapsular fluid collection  - Patient with left-sided nephrolithiasis, had left ureteral stent placed in January at [facility], replaced 07/27/18, and now p/w worsening flank pain, dysuria, and fevers/chills  - He is afebrile here, but has tachycardia, leukocytosis, and mild AKI with urinalysis equivocal  - CT with large low-density subcapsular fluid collection involving the left kidney, left nephroureteral stent, left hydronephrosis   - Blood and urine cultures collected in ED, IVF given, and empiric antibiotics started  - Urology consulted by ED physician and much appreciated, no urgent procedure needed   - Check lactate, continue IVF hydration, empiric antibiotics with cefepime given recent instrumentation, follow cultures and clinical course    2. AKI  - SCr is 1.28 on admission, up from 0.81 in December  - Renally-dose medications, avoid nephrotoxins, continue IVF hydration, check FENa, repeat chem panel in am   3. Anemia; thrombocytosis  - Hgb slightly low, down 2 g from  December, no overt bleeding, possibly from hematuria and possible subcapsular hematoma  - Platelets newly elevated, likely reactive to #1  - Repeat CBC in am    DVT prophylaxis: SCD's  Code Status: Full  Family Communication: Discussed with patient  Consults called: Urology consulted by ED physician  Admission status: Observation   Vianne Bulls, MD [facility] Pager [phone]  If 7PM-7AM, please contact night-coverage [email or web address] Password [email or web address]  08/22/2018, 8:40 PM

## 2018-08-23 ENCOUNTER — Encounter (HOSPITAL_COMMUNITY)
Admission: EM | Disposition: A | Discharge status: Home / Self Care | Payer: Self-pay | Source: Home / Self Care | Attending: Internal Medicine

## 2018-08-23 ENCOUNTER — Inpatient Hospital Stay (HOSPITAL_COMMUNITY): Payer: Medicaid - Out of State

## 2018-08-23 ENCOUNTER — Inpatient Hospital Stay (HOSPITAL_COMMUNITY): Payer: Medicaid - Out of State | Admitting: Anesthesiology

## 2018-08-23 ENCOUNTER — Encounter (HOSPITAL_COMMUNITY): Payer: Self-pay | Admitting: Certified Registered Nurse Anesthetist

## 2018-08-23 DIAGNOSIS — A419 Sepsis, unspecified organism: Secondary | ICD-10-CM | POA: Diagnosis present

## 2018-08-23 DIAGNOSIS — Z86718 Personal history of other venous thrombosis and embolism: Secondary | ICD-10-CM | POA: Diagnosis not present

## 2018-08-23 DIAGNOSIS — D62 Acute posthemorrhagic anemia: Secondary | ICD-10-CM | POA: Diagnosis present

## 2018-08-23 DIAGNOSIS — N136 Pyonephrosis: Secondary | ICD-10-CM | POA: Diagnosis present

## 2018-08-23 DIAGNOSIS — F17210 Nicotine dependence, cigarettes, uncomplicated: Secondary | ICD-10-CM | POA: Diagnosis present

## 2018-08-23 DIAGNOSIS — Z86711 Personal history of pulmonary embolism: Secondary | ICD-10-CM | POA: Diagnosis not present

## 2018-08-23 DIAGNOSIS — Y838 Other surgical procedures as the cause of abnormal reaction of the patient, or of later complication, without mention of misadventure at the time of the procedure: Secondary | ICD-10-CM | POA: Diagnosis present

## 2018-08-23 DIAGNOSIS — T83192A Other mechanical complication of urinary stent, initial encounter: Secondary | ICD-10-CM | POA: Diagnosis present

## 2018-08-23 DIAGNOSIS — F1111 Opioid abuse, in remission: Secondary | ICD-10-CM

## 2018-08-23 DIAGNOSIS — R310 Gross hematuria: Secondary | ICD-10-CM | POA: Diagnosis present

## 2018-08-23 DIAGNOSIS — F1123 Opioid dependence with withdrawal: Secondary | ICD-10-CM | POA: Diagnosis not present

## 2018-08-23 DIAGNOSIS — R109 Unspecified abdominal pain: Secondary | ICD-10-CM | POA: Diagnosis present

## 2018-08-23 DIAGNOSIS — N179 Acute kidney failure, unspecified: Secondary | ICD-10-CM | POA: Diagnosis present

## 2018-08-23 DIAGNOSIS — N200 Calculus of kidney: Secondary | ICD-10-CM

## 2018-08-23 HISTORY — PX: CYSTOSCOPY W/ URETERAL STENT PLACEMENT: SHX1429

## 2018-08-23 LAB — CBC WITH DIFFERENTIAL/PLATELET
Abs Immature Granulocytes: 0.07 10*3/uL (ref 0.00–0.07)
BASOS PCT: 0 %
Basophils Absolute: 0 10*3/uL (ref 0.0–0.1)
Eosinophils Absolute: 0.1 10*3/uL (ref 0.0–0.5)
Eosinophils Relative: 1 %
HCT: 29.1 % — ABNORMAL LOW (ref 39.0–52.0)
Hemoglobin: 9.3 g/dL — ABNORMAL LOW (ref 13.0–17.0)
Immature Granulocytes: 1 %
Lymphocytes Relative: 14 %
Lymphs Abs: 1.7 10*3/uL (ref 0.7–4.0)
MCH: 27.5 pg (ref 26.0–34.0)
MCHC: 32 g/dL (ref 30.0–36.0)
MCV: 86.1 fL (ref 80.0–100.0)
Monocytes Absolute: 0.6 10*3/uL (ref 0.1–1.0)
Monocytes Relative: 5 %
Neutro Abs: 9.8 10*3/uL — ABNORMAL HIGH (ref 1.7–7.7)
Neutrophils Relative %: 79 %
Platelets: 501 10*3/uL — ABNORMAL HIGH (ref 150–400)
RBC: 3.38 MIL/uL — ABNORMAL LOW (ref 4.22–5.81)
RDW: 13.2 % (ref 11.5–15.5)
WBC: 12.3 10*3/uL — ABNORMAL HIGH (ref 4.0–10.5)
nRBC: 0 % (ref 0.0–0.2)

## 2018-08-23 LAB — CREATININE, URINE, RANDOM: Creatinine, Urine: 246.5 mg/dL

## 2018-08-23 LAB — TYPE AND SCREEN
ABO/RH(D): B NEG
Antibody Screen: NEGATIVE

## 2018-08-23 LAB — BASIC METABOLIC PANEL
Anion gap: 9 (ref 5–15)
BUN: 13 mg/dL (ref 6–20)
CALCIUM: 8.1 mg/dL — AB (ref 8.9–10.3)
CO2: 22 mmol/L (ref 22–32)
CREATININE: 1.04 mg/dL (ref 0.61–1.24)
Chloride: 107 mmol/L (ref 98–111)
GFR calc Af Amer: >60 mL/min (ref >60)
Glucose, Bld: 99 mg/dL (ref 70–99)
Potassium: 4 mmol/L (ref 3.5–5.1)
Sodium: 138 mmol/L (ref 135–145)

## 2018-08-23 LAB — SODIUM, URINE, RANDOM: Sodium, Ur: 71 mmol/L

## 2018-08-23 LAB — HEMOGLOBIN AND HEMATOCRIT, BLOOD
HCT: 28.3 % — ABNORMAL LOW (ref 39.0–52.0)
Hemoglobin: 9.5 g/dL — ABNORMAL LOW (ref 13.0–17.0)

## 2018-08-23 LAB — LACTIC ACID, PLASMA: Lactic Acid, Venous: 0.8 mmol/L (ref 0.5–1.9)

## 2018-08-23 LAB — SURGICAL PCR SCREEN
MRSA, PCR: NEGATIVE
Staphylococcus aureus: NEGATIVE

## 2018-08-23 LAB — HIV ANTIBODY (ROUTINE TESTING W REFLEX): HIV Screen 4th Generation wRfx: NONREACTIVE

## 2018-08-23 LAB — GLUCOSE, CAPILLARY: Glucose-Capillary: 105 mg/dL — ABNORMAL HIGH (ref 70–99)

## 2018-08-23 SURGERY — CYSTOSCOPY, WITH RETROGRADE PYELOGRAM AND URETERAL STENT INSERTION
Anesthesia: General | Site: Ureter | Laterality: Left

## 2018-08-23 MED ORDER — LACTATED RINGERS IV SOLN
INTRAVENOUS | Status: DC
  Administered 2018-08-23: 16:00:00 via INTRAVENOUS

## 2018-08-23 MED ORDER — MUPIROCIN 2 % EX OINT
1.0000 "application " | TOPICAL_OINTMENT | Freq: Two times a day (BID) | CUTANEOUS | Status: DC
  Filled 2018-08-23: qty 22

## 2018-08-23 MED ORDER — DEXMEDETOMIDINE HCL 200 MCG/2ML IV SOLN
INTRAVENOUS | Status: DC | PRN
  Administered 2018-08-23 (×5): 8 ug via INTRAVENOUS

## 2018-08-23 MED ORDER — ONDANSETRON HCL 4 MG/2ML IJ SOLN
INTRAMUSCULAR | Status: AC
  Filled 2018-08-23: qty 2

## 2018-08-23 MED ORDER — OXYCODONE HCL 5 MG PO TABS: 5.0000 mg | ORAL_TABLET | Freq: Once | ORAL | Status: DC | PRN

## 2018-08-23 MED ORDER — DEXAMETHASONE SODIUM PHOSPHATE 10 MG/ML IJ SOLN
INTRAMUSCULAR | Status: AC
  Filled 2018-08-23: qty 1

## 2018-08-23 MED ORDER — SUGAMMADEX SODIUM 200 MG/2ML IV SOLN
INTRAVENOUS | Status: DC | PRN
  Administered 2018-08-23: 225 mg via INTRAVENOUS

## 2018-08-23 MED ORDER — PROPOFOL 10 MG/ML IV BOLUS
INTRAVENOUS | Status: AC
  Filled 2018-08-23: qty 20

## 2018-08-23 MED ORDER — FENTANYL CITRATE (PF) 250 MCG/5ML IJ SOLN
INTRAMUSCULAR | Status: AC
  Filled 2018-08-23: qty 5

## 2018-08-23 MED ORDER — FENTANYL CITRATE (PF) 100 MCG/2ML IJ SOLN
INTRAMUSCULAR | Status: DC | PRN
  Administered 2018-08-23 (×3): 50 ug via INTRAVENOUS

## 2018-08-23 MED ORDER — FENTANYL CITRATE (PF) 100 MCG/2ML IJ SOLN: 25.0000 ug | INTRAMUSCULAR | Status: DC | PRN

## 2018-08-23 MED ORDER — PROPOFOL 10 MG/ML IV BOLUS
INTRAVENOUS | Status: DC | PRN
  Administered 2018-08-23: 200 mg via INTRAVENOUS

## 2018-08-23 MED ORDER — POTASSIUM CHLORIDE 2 MEQ/ML IV SOLN: INTRAVENOUS | Status: DC

## 2018-08-23 MED ORDER — KETOROLAC TROMETHAMINE 30 MG/ML IJ SOLN
INTRAMUSCULAR | Status: AC
  Filled 2018-08-23: qty 1

## 2018-08-23 MED ORDER — HYDROMORPHONE HCL 1 MG/ML IJ SOLN
1.0000 mg | INTRAMUSCULAR | Status: AC | PRN
  Administered 2018-08-23 – 2018-08-24 (×3): 1 mg via INTRAVENOUS
  Filled 2018-08-23 (×3): qty 1

## 2018-08-23 MED ORDER — KETOROLAC TROMETHAMINE 30 MG/ML IJ SOLN
INTRAMUSCULAR | Status: DC | PRN
  Administered 2018-08-23: 30 mg via INTRAVENOUS

## 2018-08-23 MED ORDER — ROCURONIUM BROMIDE 100 MG/10ML IV SOLN
INTRAVENOUS | Status: DC | PRN
  Administered 2018-08-23: 50 mg via INTRAVENOUS
  Administered 2018-08-23: 20 mg via INTRAVENOUS

## 2018-08-23 MED ORDER — OXYCODONE HCL 5 MG/5ML PO SOLN: 5.0000 mg | Freq: Once | ORAL | Status: DC | PRN

## 2018-08-23 MED ORDER — ONDANSETRON HCL 4 MG/2ML IJ SOLN: 4.0000 mg | Freq: Once | INTRAMUSCULAR | Status: DC | PRN

## 2018-08-23 MED ORDER — LIDOCAINE HCL URETHRAL/MUCOSAL 2 % EX GEL
CUTANEOUS | Status: AC
  Filled 2018-08-23: qty 40

## 2018-08-23 MED ORDER — MIDAZOLAM HCL 2 MG/2ML IJ SOLN
INTRAMUSCULAR | Status: AC
  Filled 2018-08-23: qty 2

## 2018-08-23 MED ORDER — MIDAZOLAM HCL 5 MG/5ML IJ SOLN
INTRAMUSCULAR | Status: DC | PRN
  Administered 2018-08-23: 2 mg via INTRAVENOUS

## 2018-08-23 MED ORDER — IOPAMIDOL (ISOVUE-370) INJECTION 76%
INTRAVENOUS | Status: DC | PRN
  Administered 2018-08-23: 50 mL

## 2018-08-23 MED ORDER — HYDROMORPHONE HCL 1 MG/ML IJ SOLN
1.0000 mg | INTRAMUSCULAR | Status: DC | PRN
  Administered 2018-08-23 (×2): 1 mg via INTRAVENOUS
  Filled 2018-08-23 (×2): qty 1

## 2018-08-23 MED ORDER — KCL IN DEXTROSE-NACL 20-5-0.45 MEQ/L-%-% IV SOLN
INTRAVENOUS | Status: DC
  Administered 2018-08-23 – 2018-08-24 (×3): via INTRAVENOUS
  Filled 2018-08-23 (×3): qty 1000

## 2018-08-23 MED ORDER — ROCURONIUM BROMIDE 50 MG/5ML IV SOSY
PREFILLED_SYRINGE | INTRAVENOUS | Status: AC
  Filled 2018-08-23: qty 15

## 2018-08-23 MED ORDER — HYDRALAZINE HCL 20 MG/ML IJ SOLN: 5.0000 mg | Freq: Four times a day (QID) | INTRAMUSCULAR | Status: DC | PRN

## 2018-08-23 MED ORDER — LIDOCAINE HCL (CARDIAC) PF 100 MG/5ML IV SOSY
PREFILLED_SYRINGE | INTRAVENOUS | Status: DC | PRN
  Administered 2018-08-23: 100 mg via INTRAVENOUS

## 2018-08-23 MED ORDER — DEXAMETHASONE SODIUM PHOSPHATE 4 MG/ML IJ SOLN
INTRAMUSCULAR | Status: DC | PRN
  Administered 2018-08-23: 5 mg via INTRAVENOUS

## 2018-08-23 MED ORDER — IOPAMIDOL (ISOVUE-300) INJECTION 61%
INTRAVENOUS | Status: AC
  Filled 2018-08-23: qty 50

## 2018-08-23 SURGICAL SUPPLY — 25 items
ADAPTER CATH URET PLST 4-6FR (CATHETERS) IMPLANT
BAG DRAIN URO-CYSTO SKYTR STRL (DRAIN) ×3 IMPLANT
BLADE 10 SAFETY STRL DISP (BLADE) ×3 IMPLANT
CATH INTERMIT  6FR 70CM (CATHETERS) IMPLANT
CATH URET 5FR 28IN CONE TIP (BALLOONS)
CATH URET 5FR 28IN OPEN ENDED (CATHETERS) ×5 IMPLANT
CATH URET 5FR 70CM CONE TIP (BALLOONS) IMPLANT
COVER WAND RF STERILE (DRAPES) ×6 IMPLANT
ELECT REM PT RETURN 9FT ADLT (ELECTROSURGICAL)
ELECTRODE REM PT RTRN 9FT ADLT (ELECTROSURGICAL) IMPLANT
GLOVE BIO SURGEON STRL SZ7.5 (GLOVE) ×3 IMPLANT
GOWN STRL REUS W/ TWL LRG LVL3 (GOWN DISPOSABLE) ×2 IMPLANT
GOWN STRL REUS W/TWL LRG LVL3 (GOWN DISPOSABLE) ×4
GUIDEWIRE ANG ZIPWIRE 038X150 (WIRE) IMPLANT
GUIDEWIRE STR DUAL SENSOR (WIRE) ×2 IMPLANT
GUIDEWIRE TEFLON GW 038X150 (WIRE) ×3 IMPLANT
IV NS IRRIG 3000ML ARTHROMATIC (IV SOLUTION) IMPLANT
MANIFOLD NEPTUNE II (INSTRUMENTS) ×12 IMPLANT
PACK CYSTO (CUSTOM PROCEDURE TRAY) ×3 IMPLANT
SOL PREP POV-IOD 4OZ 10% (MISCELLANEOUS) ×3 IMPLANT
STENT PERCUFLEX 4.8FRX28 (STENTS) ×2 IMPLANT
STENT URET 6FRX28 CONTOUR (STENTS) IMPLANT
TUBE CONNECTING 12'X1/4 (SUCTIONS) ×1
TUBE CONNECTING 12X1/4 (SUCTIONS) ×2 IMPLANT
WATER STERILE IRR 3000ML UROMA (IV SOLUTION) IMPLANT

## 2018-08-23 NOTE — Consult Note (Signed)
I have been asked to see the patient by Dr. Pryor Curia, for evaluation and management of left perinephric fluid collection.  History of present illness: Patient s/p staged left ureteroscopy in [facility] for 65mm proximal stone on Feb 2.  He had a stent placed 3 weeks prior for a stenotic/tight ureter.  At the end of the case there was note of some extravasation of contrast in the upper pole at the time of the stent placement.  The stent tether was left on and shortened so the stent could be removed by the patient 1 week after surgery.  The stent remains in today, because the patient couldn't find the tether.  He has not followed-up with his primary urologists.   The patient was struggling with poorly controlled pain post-op and thus presented to the ED.  He also was stating that he had low grade fevers yesterday.    A CT scan shows proximal hydronephrosis (likely residual for long-standing obstructing proximal stone) with a large fluid collection on the posterior aspect of the kidney. He had a slight elevation in his WBC and his kidney function was very mildly elevated.  His urine did not look infected.   He is being admitted for hydration and pain control.  Overnight, the patient was febrile to 103.  He is otherwise hemodynamically stable with mild tachycardia.  Stable blood pressure.  He was given some Tylenol p.o., and the fever resolved.  His main complaint is pain.  The pain is predominantly in his left flank region, and hurts mostly when he lies down.  This pain is been present since the time of his operation.  He denies any other symptoms including dysuria, worsening frequency or urgency.  Review of systems: A 12 point comprehensive review of systems was obtained and is negative unless otherwise stated in the history of present illness.  Patient Active Problem List   Diagnosis Date Noted  . Sepsis secondary to UTI ([place]) 08/22/2018  . Hydronephrosis of left kidney 08/22/2018  . AKI (acute  kidney injury) ([place]) 08/22/2018  . Normocytic anemia 08/22/2018  . Thrombocytosis ([place]) 08/22/2018  . Left flank pain   . Lower abdominal pain 04/21/2018  . Left Renal calculus 04/21/2018  . History of deep venous thrombosis (DVT) of distal vein of left lower extremity 04/30/2016  . History of pulmonary embolism 04/26/2016  . Opioid abuse, in remission ([place]) 04/26/2016  . 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 Tobacco abuse 04/26/2016    No current facility-administered medications on file prior to encounter.    Current Outpatient Medications on File Prior to Encounter  Medication Sig Dispense Refill  . acetaminophen (TYLENOL) 500 MG tablet Take 1,000 mg by mouth every 6 (six) hours as needed for headache (pain).    Marland Kitchen ibuprofen (ADVIL,MOTRIN) 800 MG tablet Take 1,600 mg by mouth daily as needed (pain).    . methadone (DOLOPHINE) 10 MG/ML solution Take 90 mg by mouth daily.       Past Medical History:  Diagnosis Date  . Methadone maintenance therapy patient ([place])   . Opioid abuse, in remission ([place])   . Pulmonary embolism [facility])     Past Surgical History:  Procedure Laterality Date  . ANKLE SURGERY    . JOINT REPLACEMENT      Social History   Tobacco Use  . Smoking status: Current Some Day Smoker    Packs/day: 0.25    Types: Cigarettes  . Smokeless tobacco: Former Systems developer  . Tobacco  comment: 1-2 cigs daily  Substance Use Topics  . Alcohol use: Yes    Alcohol/week: 1.0 standard drinks    Types: 1 Cans of beer per week    Comment: Occasional  . Drug use: No    Comment: Hx of Opioid abuse.     Family History  Problem Relation Age of Onset  . Pulmonary embolism Maternal Grandfather     PE: Vitals:   08/22/18 2130 08/22/18 2139 08/22/18 2210 08/23/18 0256  BP: (!) 150/95  (!) 152/73 138/87  Pulse:   (!) 112 95  Resp:   18 18  Temp:  98.8 F (37.1 C) (!) 103.1 F (39.5 C) (!) 100.9 F (38.3 C)  TempSrc:  Oral Oral Oral  SpO2:   100% 99%   Weight:      Height:       Patient appears to be in no acute distress  patient is alert and oriented x3 Atraumatic normocephalic head No cervical or supraclavicular lymphadenopathy appreciated No increased work of breathing, no audible wheezes/rhonchi Regular sinus rhythm/rate Abdomen is soft, nontender, nondistended, significant left CVA tenderness Lower extremities are symmetric without appreciable edema Grossly neurologically intact No identifiable skin lesions  Recent Labs    08/22/18 1810 08/23/18 0020  WBC 12.3* 12.3*  HGB 11.4* 9.3*  HCT 36.5* 29.1*   Recent Labs    08/22/18 1810 08/23/18 0020  NA 140 138  K 3.6 4.0  CL 104 107  CO2 23 22  GLUCOSE 88 99  BUN 16 13  CREATININE 1.28* 1.04  CALCIUM 9.4 8.1*   Recent Labs    08/22/18 2141  INR 1.2   No results for input(s): LABURIN in the last 72 hours. Results for orders placed or performed in visit on 04/21/18  Urine Culture     Status: None   Collection Time: 04/21/18 10:25 AM  Result Value Ref Range Status   Urine Culture, Routine Final report  Final   Organism ID, Bacteria Comment  Final    Comment: Mixed urogenital flora 10,000-25,000 colony forming units per mL     Imaging: I have independently reviewed the patient's CT scan which demonstrates a large left perinephric fluid collection in the posterior aspect of the kidney and does track along the psoas.  The fluid seems to be contained.  There is no evidence that this is an abscess.  In addition, the stent is in appropriate position.  There is some thickening of the stent in the mid ureter which may suggest some of the stone fragments have accumulated around this area.  He does have some proximal hydronephrosis.  Imp: The patient was febrile to 103F last night.  He is stable today from a hemodynamic perspective.  His biggest issue is pain.  I do not think the fluid collection is infected at this time based on the imaging.  I discussed treatment  options with the patient and recommended that we exchange his stent as this may improve the drainage of the kidney and help with his pain as well as his fevers.  At this time, would not recommend that we drain the fluid collection, but if he continues to have fevers in the postoperative setting after a stent is exchanged, this may become obvious and necessary.  Recommendations: I have made the patient n.p.o. and started him on some maintenance IV fluids.  Obtain consent for a left ureteral stent exchange.  This will be an add-on, at the end of the day.  The patient  should continue broad-spectrum antibiotics.  We will plan to do this case here at [facility].  Thank you for involving me in this patient's care, I will continue to follow along.   [name]

## 2018-08-23 NOTE — Op Note (Signed)
Preoperative diagnosis:  1. left obstructed ureteral stent  Postoperative diagnosis:  1. Same  Procedure:  1. Cystoscopy 2. left ureteral stent exchange 3. left retrograde pyelography with interpretation   Surgeon: [name], MD  Anesthesia: General  Complications: None  Intraoperative findings:  #1: The patient's stent tether was emanating from his urethral meatus.  I was unable to wire the stent because of occlusion within the stent at the distal aspect. #2: Retrograde pyelogram demonstrated a hydronephrotic kidney that was displaced medially.  The ureter was stenosed in the mid ureter region.  There was a area that looks like a duplicated part of the ureter, which the initial stent was emanating from.  The question that I wonder is whether the stent was through and through the ureter-leaving the ureter in the mid section and then reentering it several centimeters more proximal. #3: I was unable to advance the 6 French ureteral stent back into the renal pelvis and as such placed a 4.8 [place] x28 cm double-J ureteral stent in the patient's left ureter. #4: Once I was able to get the open-ended catheter into the renal pelvis there was a significant hydronephrotic drip which I sent for a urine culture.  EBL: Minimal  Specimens: Urine from the left renal pelvis  Indication: Glen Lambert is a 28 y.o. patient with history of a large UPJ stone that was treated with ureteroscopy.  This was a staged procedure given the stenosis of the patient's ureter.  Following the patient's second procedure he had persistent pain and over the past several days has developed fever and lethargy.  A CT scan demonstrated some mild hydroureteronephrosis.  Overnight the patient was febrile to 103 F.  As such, and after reviewing the management options for treatment, he elected to proceed with the above surgical procedure(s). We have discussed the potential benefits and risks of the procedure, side  effects of the proposed treatment, the likelihood of the patient achieving the goals of the procedure, and any potential problems that might occur during the procedure or recuperation. Informed consent has been obtained.  Description of procedure:  The patient was taken to the operating room and general anesthesia was induced.  The patient was placed in the dorsal lithotomy position, prepped and draped in the usual sterile fashion, and preoperative antibiotics were administered. A preoperative time-out was performed.  I attempted to remove the stent by pulling on the tether that was emanating from the patient's urethral meatus and placing a 0.038 sensor wire through the stent.  I was unable to get the wire more than 5 cm into the stent.  As such, cystourethroscopy was performed alongside the stent.  The patient's urethra was examined and was normal. The bladder was then systematically examined in its entirety. There was no evidence for any bladder tumors, stones, or other mucosal pathology.    Attention then turned to the leftureteral orifice and a ureteral catheter was used to intubate the ureteral orifice.  Omnipaque contrast was injected through the ureteral catheter and a retrograde pyelogram was performed with findings as dictated above.  A 0.38 sensor guidewire was then advanced up the left ureter into the renal pelvis under fluoroscopic guidance with some difficulty.  I then drained the renal pelvis through the open-ended catheter and sent the urine to be cultured.  I then repassed the wire.  I then removed the cystoscope and grasped the stent at the patient's penile urethra brought to the urethral meatus with stent graspers.  I then  removed the stent which initially through the proximal and mid ureter was difficult to slide.  The wire was then backloaded through the cystoscope and a ureteral stent was advance over the wire using Seldinger technique.  I was unable to get the 6 French ureteral stent  beyond the mid ureter and as such the 6 French stent was removed and exchanged for a 4.8 [place] x28 cm stent.  The stent was positioned appropriately under fluoroscopic and cystoscopic guidance.  The wire was then removed with an adequate stent curl noted in the renal pelvis as well as in the bladder.  The bladder was then emptied and the procedure ended.  The patient appeared to tolerate the procedure well and without complications.  The patient was able to be awakened and transferred to the recovery unit in satisfactory condition.    [name], M.D.

## 2018-08-23 NOTE — Anesthesia Procedure Notes (Signed)
Procedure Name: Intubation Date/Time: 08/23/2018 5:22 PM Performed by: Oletta Lamas, CRNA Pre-anesthesia Checklist: Patient identified, Emergency Drugs available, Suction available and Patient being monitored Patient Re-evaluated:Patient Re-evaluated prior to induction Oxygen Delivery Method: Circle System Utilized Preoxygenation: Pre-oxygenation with 100% oxygen Induction Type: IV induction Ventilation: Mask ventilation without difficulty Laryngoscope Size: Miller and 3 Grade View: Grade I Tube type: Oral Number of attempts: 1 Airway Equipment and Method: Stylet Placement Confirmation: ETT inserted through vocal cords under direct vision,  positive ETCO2 and breath sounds checked- equal and bilateral Secured at: 22 cm Tube secured with: Tape Dental Injury: Teeth and Oropharynx as per pre-operative assessment

## 2018-08-23 NOTE — Progress Notes (Signed)
The patient underwent a left ureteral stent exchange.  The stent was obstructed, and I think when it was initially placed was passed through the ureteral wall.  Thus, urine then was extravasating through the ureteral wall resulting in a large urinoma as seen on the patient's CT scan.  Once the stent was exchanged the hydronephrotic kidney was drained and the urine was sent for culture.  I suspect the patient will feel significantly better.  However, he does still have a large urinoma behind the kidney and as such if his pain is not significantly better in the morning, I would consider draining the urinoma and interventional radiology.  This would certainly expedite his stay and help with his pain.  The patient will need to return to the operating room in several weeks time for exploration of that left ureter to reevaluate the strictured segment, balloon dilated, and a bigger stent.  I met

## 2018-08-23 NOTE — Progress Notes (Signed)
Patient received received from OR, family at bedside. Vitals signs stable, patient rated pain at 10 and Hydromorphone 1 mg given. Suzy Bouchard, RN.

## 2018-08-23 NOTE — H&P (View-Only) (Signed)
I have been asked to see the patient by Dr. Pryor Curia, for evaluation and management of left perinephric fluid collection.  History of present illness: Patient s/p staged left ureteroscopy in [facility] for 43mm proximal stone on Feb 2.  He had a stent placed 3 weeks prior for a stenotic/tight ureter.  At the end of the case there was note of some extravasation of contrast in the upper pole at the time of the stent placement.  The stent tether was left on and shortened so the stent could be removed by the patient 1 week after surgery.  The stent remains in today, because the patient couldn't find the tether.  He has not followed-up with his primary urologists.   The patient was struggling with poorly controlled pain post-op and thus presented to the ED.  He also was stating that he had low grade fevers yesterday.    A CT scan shows proximal hydronephrosis (likely residual for long-standing obstructing proximal stone) with a large fluid collection on the posterior aspect of the kidney. He had a slight elevation in his WBC and his kidney function was very mildly elevated.  His urine did not look infected.   He is being admitted for hydration and pain control.  Overnight, the patient was febrile to 103.  He is otherwise hemodynamically stable with mild tachycardia.  Stable blood pressure.  He was given some Tylenol p.o., and the fever resolved.  His main complaint is pain.  The pain is predominantly in his left flank region, and hurts mostly when he lies down.  This pain is been present since the time of his operation.  He denies any other symptoms including dysuria, worsening frequency or urgency.  Review of systems: A 12 point comprehensive review of systems was obtained and is negative unless otherwise stated in the history of present illness.  Patient Active Problem List   Diagnosis Date Noted  . Sepsis secondary to UTI ([place]) 08/22/2018  . Hydronephrosis of left kidney 08/22/2018  . AKI (acute  kidney injury) ([place]) 08/22/2018  . Normocytic anemia 08/22/2018  . Thrombocytosis ([place]) 08/22/2018  . Left flank pain   . Lower abdominal pain 04/21/2018  . Left Renal calculus 04/21/2018  . History of deep venous thrombosis (DVT) of distal vein of left lower extremity 04/30/2016  . History of pulmonary embolism 04/26/2016  . Opioid abuse, in remission ([place]) 04/26/2016  . 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 Tobacco abuse 04/26/2016    No current facility-administered medications on file prior to encounter.    Current Outpatient Medications on File Prior to Encounter  Medication Sig Dispense Refill  . acetaminophen (TYLENOL) 500 MG tablet Take 1,000 mg by mouth every 6 (six) hours as needed for headache (pain).    Marland Kitchen ibuprofen (ADVIL,MOTRIN) 800 MG tablet Take 1,600 mg by mouth daily as needed (pain).    . methadone (DOLOPHINE) 10 MG/ML solution Take 90 mg by mouth daily.       Past Medical History:  Diagnosis Date  . 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 Opioid abuse, in remission ([name])   . Pulmonary embolism [facility])     Past Surgical History:  Procedure Laterality Date  . ANKLE SURGERY    . JOINT REPLACEMENT      Social History   Tobacco Use  . Smoking status: Current Some Day Smoker    Packs/day: 0.25    Types: Cigarettes  . Smokeless tobacco: Former Systems developer  . Tobacco  comment: 1-2 cigs daily  Substance Use Topics  . Alcohol use: Yes    Alcohol/week: 1.0 standard drinks    Types: 1 Cans of beer per week    Comment: Occasional  . Drug use: No    Comment: Hx of Opioid abuse.     Family History  Problem Relation Age of Onset  . Pulmonary embolism Maternal Grandfather     PE: Vitals:   08/22/18 2130 08/22/18 2139 08/22/18 2210 08/23/18 0256  BP: (!) 150/95  (!) 152/73 138/87  Pulse:   (!) 112 95  Resp:   18 18  Temp:  98.8 F (37.1 C) (!) 103.1 F (39.5 C) (!) 100.9 F (38.3 C)  TempSrc:  Oral Oral Oral  SpO2:   100% 99%   Weight:      Height:       Patient appears to be in no acute distress  patient is alert and oriented x3 Atraumatic normocephalic head No cervical or supraclavicular lymphadenopathy appreciated No increased work of breathing, no audible wheezes/rhonchi Regular sinus rhythm/rate Abdomen is soft, nontender, nondistended, significant left CVA tenderness Lower extremities are symmetric without appreciable edema Grossly neurologically intact No identifiable skin lesions  Recent Labs    08/22/18 1810 08/23/18 0020  WBC 12.3* 12.3*  HGB 11.4* 9.3*  HCT 36.5* 29.1*   Recent Labs    08/22/18 1810 08/23/18 0020  NA 140 138  K 3.6 4.0  CL 104 107  CO2 23 22  GLUCOSE 88 99  BUN 16 13  CREATININE 1.28* 1.04  CALCIUM 9.4 8.1*   Recent Labs    08/22/18 2141  INR 1.2   No results for input(s): LABURIN in the last 72 hours. Results for orders placed or performed in visit on 04/21/18  Urine Culture     Status: None   Collection Time: 04/21/18 10:25 AM  Result Value Ref Range Status   Urine Culture, Routine Final report  Final   Organism ID, Bacteria Comment  Final    Comment: Mixed urogenital flora 10,000-25,000 colony forming units per mL     Imaging: I have independently reviewed the patient's CT scan which demonstrates a large left perinephric fluid collection in the posterior aspect of the kidney and does track along the psoas.  The fluid seems to be contained.  There is no evidence that this is an abscess.  In addition, the stent is in appropriate position.  There is some thickening of the stent in the mid ureter which may suggest some of the stone fragments have accumulated around this area.  He does have some proximal hydronephrosis.  Imp: The patient was febrile to 103F last night.  He is stable today from a hemodynamic perspective.  His biggest issue is pain.  I do not think the fluid collection is infected at this time based on the imaging.  I discussed treatment  options with the patient and recommended that we exchange his stent as this may improve the drainage of the kidney and help with his pain as well as his fevers.  At this time, would not recommend that we drain the fluid collection, but if he continues to have fevers in the postoperative setting after a stent is exchanged, this may become obvious and necessary.  Recommendations: I have made the patient n.p.o. and started him on some maintenance IV fluids.  Obtain consent for a left ureteral stent exchange.  This will be an add-on, at the end of the day.  The patient  should continue broad-spectrum antibiotics.  We will plan to do this case here at [facility].  Thank you for involving me in this patient's care, I will continue to follow along.   [name]

## 2018-08-23 NOTE — Anesthesia Postprocedure Evaluation (Signed)
Anesthesia Post Note  Patient: Glen Lambert  Procedure(s) Performed: CYSTOSCOPY WITH RETROGRADE PYELOGRAM/URETERAL STENT PLACEMENT (Left Ureter)     Patient location during evaluation: PACU Anesthesia Type: General Level of consciousness: awake and alert Pain management: pain level controlled Vital Signs Assessment: post-procedure vital signs reviewed and stable Respiratory status: spontaneous breathing, nonlabored ventilation, respiratory function stable and patient connected to nasal cannula oxygen Cardiovascular status: blood pressure returned to baseline and stable Postop Assessment: no apparent nausea or vomiting Anesthetic complications: no    Last Vitals:  Vitals:   08/23/18 1019 08/23/18 1830  BP: (!) 144/94 (!) 161/96  Pulse: 89 (!) 114  Resp: 18 (!) 25  Temp: 37.2 C (!) 36.3 C  SpO2: 99% 97%    Last Pain:  Vitals:   08/23/18 1141  TempSrc:   PainSc: Asleep                 Blakely Gluth COKER

## 2018-08-23 NOTE — Progress Notes (Addendum)
Progress Note    Glen Lambert  ZOX:[id number] DOB: 10-28-1990  DOA: 08/22/2018 PCP: Charlott Rakes, MD    Brief Narrative:   Chief complaint: Left flank pain, fever, and chills  Medical records reviewed and are as summarized below:  Glen Lambert is an 28 y.o. male  With pmh of opioid dependence, nephrolithiasis, left nephroureteral stent placement; who presented with left flank pain, fevers, and chills.  Patient found to have urinary tract infection with left-sided nephrolithiasis and hydronephrosis despite recent stent placement and new subcapsular fluid collection on imaging.  Assessment/Plan:   Principal Problem:   Sepsis secondary to UTI [facility]) Active Problems:   Opioid abuse, in remission ([place])   Left Renal calculus   Hydronephrosis of left kidney   AKI (acute kidney injury) ([place])   Normocytic anemia   Thrombocytosis ([facility])  Sepsis secondary to UTI, left nephrolithiasis and hydronephrosis s/p stent, and subcapsular fluid collection: Patient presents with recurrence of left-sided flank pain.  With left-sided nephrolithiasis status post left ureteral stent placed on 06/2018 at [facility], then replaced on 07/27/2018.  Patient was noted to be tachypneic, tachycardic and had fever up to 103.1 F.  WBC elevated at 12.3, but once lactic obtained noted to be reassuring at 1.2.  Urinalysis was positive for signs of infection.  CT scanning showing large low-density subcapsular fluid collection of the left kidney, left ureter stent, left hydronephrosis.  Blood cultures currently show no growth and urine cultures are still in process.  Urology consulted and plan on patient patient for stent placement today. -Follow-up blood and urine cultures -Continue empiric antibiotics of cefepime -Morphine changed to Dilaudid prn severe pain -Tylenol as needed for fever -Appreciate urology consultative services, will follow-up for further recommendations  Acute kidney injury: Creatinine 1.28 on admission  from 0.81 in 05/2018.   -Avoid nephrotoxic agents -Continue IV fluids -Repeat BMP in a.m.  Acute blood loss anemia: Hemoglobin 11.4-> 9.3.  Question if fluid collection is blood. -Type and screen for possible need of blood products -Transfuse blood products, if hemoglobin drops less than 7 or patient becomes symptomatic -Recheck CBC in a.m.  Thrombocytosis: Suspect reactive in nature. -Continue to monitor  Body mass index is 30.81 kg/m.   Family Communication/Anticipated D/C date and plan/Code Status   DVT prophylaxis: SCDs Code Status: Full Code.  Family Communication: Discussed plan of care with the patient and family present at bedside Disposition Plan: Discharge home in 2 to 3 days   Medical Consultants:    Urology   Anti-Infectives:    Cefepime day 2  Subjective:   Patient reports that he still having a lot of left sided flank pain that has not completely relieved with morphine and hydrocodone.  Questions when he can be restarted on his methadone.  Objective:    Vitals:   08/22/18 2139 08/22/18 2210 08/23/18 0256 08/23/18 1019  BP:  (!) 152/73 138/87 (!) 144/94  Pulse:  (!) 112 95 89  Resp:  18 18 18   Temp: 98.8 F (37.1 C) (!) 103.1 F (39.5 C) (!) 100.9 F (38.3 C) 99 F (37.2 C)  TempSrc: Oral Oral Oral Oral  SpO2:  100% 99% 99%  Weight:      Height:        Intake/Output Summary (Last 24 hours) at 08/23/2018 1500 Last data filed at 08/23/2018 1133 Gross per 24 hour  Intake 4230 ml  Output 1105 ml  Net 3125 ml   Filed Weights   08/22/18 1718  Weight: 108.9  kg    Exam: Constitutional: Young male who appears to be comfortable but in no acute distress at this time.   Eyes: PERRL, lids and conjunctivae normal ENMT: Mucous membranes are dry.  Posterior pharynx clear of any exudate or lesions. Normal dentition.  Neck: normal, supple, no masses, no thyromegaly Respiratory: clear to auscultation bilaterally, no wheezing, no crackles. Normal  respiratory effort. No accessory muscle use.  Cardiovascular: Regular rate and rhythm, no murmurs / rubs / gallops. No extremity edema. 2+ pedal pulses. No carotid bruits.  Abdomen: Left CVA tenderness Musculoskeletal: no clubbing / cyanosis. No joint deformity upper and lower extremities. Good ROM, no contractures. Normal muscle tone.  Skin: no rashes, lesions, ulcers. No induration Neurologic: CN 2-12 grossly intact. Sensation intact, DTR normal. Strength 5/5 in all 4.  Psychiatric: Normal judgment and insight. Alert and oriented x 3. Normal mood.    Data Reviewed:   I have personally reviewed following labs and imaging studies:  Labs: Labs show the following:   Basic Metabolic Panel: Recent Labs  Lab 08/22/18 1810 08/23/18 0020  NA 140 138  K 3.6 4.0  CL 104 107  CO2 23 22  GLUCOSE 88 99  BUN 16 13  CREATININE 1.28* 1.04  CALCIUM 9.4 8.1*   GFR Estimated Creatinine Clearance: 139 mL/min (by C-G formula based on SCr of 1.04 mg/dL). Liver Function Tests: No results for input(s): AST, ALT, ALKPHOS, BILITOT, PROT, ALBUMIN in the last 168 hours. No results for input(s): LIPASE, AMYLASE in the last 168 hours. No results for input(s): AMMONIA in the last 168 hours. Coagulation profile Recent Labs  Lab 08/22/18 2141  INR 1.2    CBC: Recent Labs  Lab 08/22/18 1810 08/23/18 0020  WBC 12.3* 12.3*  NEUTROABS 9.4* 9.8*  HGB 11.4* 9.3*  HCT 36.5* 29.1*  MCV 88.8 86.1  PLT 483* 501*   Cardiac Enzymes: No results for input(s): CKTOTAL, CKMB, CKMBINDEX, TROPONINI in the last 168 hours. BNP (last 3 results) No results for input(s): PROBNP in the last 8760 hours. CBG: Recent Labs  Lab 08/23/18 0744  GLUCAP 105*   D-Dimer: No results for input(s): DDIMER in the last 72 hours. Hgb A1c: No results for input(s): HGBA1C in the last 72 hours. Lipid Profile: No results for input(s): CHOL, HDL, LDLCALC, TRIG, CHOLHDL, LDLDIRECT in the last 72 hours. Thyroid function  studies: No results for input(s): TSH, T4TOTAL, T3FREE, THYROIDAB in the last 72 hours.  Invalid input(s): FREET3 Anemia work up: No results for input(s): VITAMINB12, FOLATE, FERRITIN, TIBC, IRON, RETICCTPCT in the last 72 hours. Sepsis Labs: Recent Labs  Lab 08/22/18 1810 08/22/18 2141 08/23/18 0020  WBC 12.3*  --  12.3*  LATICACIDVEN  --  1.4 0.8    Microbiology Recent Results (from the past 240 hour(s))  Urine Culture     Status: None (Preliminary result)   Collection Time: 08/22/18  6:39 PM  Result Value Ref Range Status   Specimen Description URINE, CLEAN CATCH  Final   Special Requests NONE  Final   Culture   Final    CULTURE REINCUBATED FOR BETTER GROWTH Performed at [facility], [place] [address] [place],  [address]    Report Status PENDING  Incomplete  Blood culture (routine x 2)     Status: None (Preliminary result)   Collection Time: 08/22/18  7:35 PM  Result Value Ref Range Status   Specimen Description BLOOD LEFT ANTECUBITAL  Final   Special Requests   Final  BOTTLES DRAWN AEROBIC AND ANAEROBIC Blood Culture adequate volume   Culture   Final    NO GROWTH < 24 HOURS Performed at [facility], [place] [address], [place], [place] [address]    Report Status PENDING  Incomplete  Blood culture (routine x 2)     Status: None (Preliminary result)   Collection Time: 08/22/18  7:59 PM  Result Value Ref Range Status   Specimen Description BLOOD RIGHT HAND  Final   Special Requests   Final    BOTTLES DRAWN AEROBIC AND ANAEROBIC Blood Culture adequate volume   Culture   Final    NO GROWTH < 24 HOURS Performed at [facility], [place] [address], [place], [place] [address]    Report Status PENDING  Incomplete    Procedures and diagnostic studies:  Ct Renal Stone Study  Result Date: 08/22/2018 CLINICAL DATA:  Left flank pain. Recent left kidney stone removal with stent placement. EXAM: CT ABDOMEN AND PELVIS WITHOUT CONTRAST TECHNIQUE:  Multidetector CT imaging of the abdomen and pelvis was performed following the standard protocol without IV contrast. COMPARISON:  04/21/2018 . FINDINGS: Lower chest: Right lung base appears clear. The left lung base is not well visualized secondary to asymmetric elevation of left hemidiaphragm. Hepatobiliary: Within the limitations of unenhanced technique there is no focal liver abnormality identified. Gallbladder unremarkable. No biliary dilatation. Pancreas: Unremarkable. No pancreatic ductal dilatation or surrounding inflammatory changes. Spleen: The spleen is displaced into the left upper quadrant of the abdomen and is only partially visualized. The visualized portions are unremarkable. Adrenals/Urinary Tract: Normal appearance of the adrenal glands. The right kidney is unremarkable. No right kidney stone or hydronephrosis. There is a left-sided nephroureteral stent in place with. Tiny stone fragments are suspected within the mid left ureter at the L4 level, image 78/9. Large left-sided subcapsular fluid collection is identified measuring 8.9 by 5.3 by 12.8 cm, image 55/8. This measures water density. There is extension of this fluid collection into the retroperitoneum. Here, there is a large, water density fluid collection measuring 8.2 by 6.7 by 17.8 cm (volume = 510 cm^3). This fluid collection extends into the left psoas muscle which is asymmetrically enlarged. Urinary bladder appears normal. Stomach/Bowel: Stomach is within normal limits. Appendix appears normal. No evidence of bowel wall thickening, distention, or inflammatory changes. Vascular/Lymphatic: No significant vascular findings are present. No enlarged abdominal or pelvic lymph nodes. Reproductive: Prostate is unremarkable. Other: No abdominal wall hernia or abnormality. No abdominopelvic ascites. Musculoskeletal: No acute or significant osseous findings. IMPRESSION: 1. There is a large low-density subcapsular fluid collection involving the left  kidney. This may represent either a large urinoma or chronic hematoma. The fluid collection extends into the left retroperitoneum where there is an approximately 510 cc fluid collection. The retroperitoneal fluid collection is also low in attenuation and may represent either a urinoma or chronic hematoma. 2. Left-sided nephroureteral stent is in place. There is left-sided hydronephrosis. Along the course of the mid left ureter there are several filling defects adjacent to the stent which may represent tiny stone fragments. Electronically Signed   By: Kerby Moors M.D.   On: 08/22/2018 18:00    Medications:   . mupirocin ointment  1 application Nasal BID  . sodium chloride flush  3 mL Intravenous Q12H   Continuous Infusions: . ceFEPime (MAXIPIME) IV 2 g (08/23/18 1023)  . dextrose 5 % and 0.45 % NaCl with KCl 20 mEq/L 100 mL/hr at 08/23/18 [facility]  LOS: 0 days    A   [facility]   *Please refer to [email or web address], password [email or web address] to get updated schedule on who will round on this patient, as hospitalists switch teams weekly. If 7PM-7AM, please contact night-coverage at [email or web address], password [email or web address] for any overnight needs.

## 2018-08-23 NOTE — Interval H&P Note (Signed)
History and Physical Interval Note:  08/23/2018 5:02 PM  Glen Lambert  has presented today for surgery, with the diagnosis of Left Stent Exchange  The various methods of treatment have been discussed with the patient and family. After consideration of risks, benefits and other options for treatment, the patient has consented to  Procedure(s): CYSTOSCOPY WITH RETROGRADE PYELOGRAM/URETERAL STENT PLACEMENT (Left) as a surgical intervention .  The patient's history has been reviewed, patient examined, no change in status, stable for surgery.  I have reviewed the patient's chart and labs.  Questions were answered to the patient's satisfaction.     [name]

## 2018-08-23 NOTE — Anesthesia Preprocedure Evaluation (Addendum)
Anesthesia Evaluation  Patient identified by MRN, date of birth, ID band Patient awake    Reviewed: Allergy & Precautions, NPO status , Patient's Chart, lab work & pertinent test results  Airway Mallampati: II  TM Distance: >3 FB Neck ROM: Full    Dental  (+) Teeth Intact, Dental Advisory Given   Pulmonary Current Smoker,    breath sounds clear to auscultation       Cardiovascular  Rhythm:Regular Rate:Normal     Neuro/Psych    GI/Hepatic   Endo/Other    Renal/GU      Musculoskeletal   Abdominal   Peds  Hematology   Anesthesia Other Findings   Reproductive/Obstetrics                             Anesthesia Physical Anesthesia Plan  ASA: II  Anesthesia Plan: General   Post-op Pain Management:    Induction: Intravenous  PONV Risk Score and Plan: Ondansetron and Dexamethasone  Airway Management Planned: Oral ETT  Additional Equipment:   Intra-op Plan:   Post-operative Plan: Extubation in OR  Informed Consent: I have reviewed the patients History and Physical, chart, labs and discussed the procedure including the risks, benefits and alternatives for the proposed anesthesia with the patient or authorized representative who has indicated his/her understanding and acceptance.     Dental advisory given  Plan Discussed with: CRNA and Anesthesiologist  Anesthesia Plan Comments:         Anesthesia Quick Evaluation  

## 2018-08-23 NOTE — Transfer of Care (Signed)
Immediate Anesthesia Transfer of Care Note  Patient: Glen Lambert  Procedure(s) Performed: CYSTOSCOPY WITH RETROGRADE PYELOGRAM/URETERAL STENT PLACEMENT (Left Ureter)  Patient Location: PACU  Anesthesia Type:General  Level of Consciousness: awake, drowsy and patient cooperative  Airway & Oxygen Therapy: Patient Spontanous Breathing  Post-op Assessment: Report given to RN and Post -op Vital signs reviewed and stable  Post vital signs: Reviewed and stable  Last Vitals:  Vitals Value Taken Time  BP 161/96 08/23/2018  6:29 PM  Temp    Pulse 113 08/23/2018  6:30 PM  Resp 27 08/23/2018  6:30 PM  SpO2 98 % 08/23/2018  6:30 PM  Vitals shown include unvalidated device data.  Last Pain:  Vitals:   08/23/18 1141  TempSrc:   PainSc: Asleep      Patients Stated Pain Goal: 0 (57/84/69 [id number])  Complications: No apparent anesthesia complications

## 2018-08-23 NOTE — Progress Notes (Signed)
Spoke with Kennon Holter, NP. Notified her of completion of boluses and VS. No new orders @ this time.

## 2018-08-24 ENCOUNTER — Encounter (HOSPITAL_COMMUNITY): Payer: Self-pay | Admitting: Urology

## 2018-08-24 DIAGNOSIS — F1123 Opioid dependence with withdrawal: Secondary | ICD-10-CM

## 2018-08-24 DIAGNOSIS — F1193 Opioid use, unspecified with withdrawal: Secondary | ICD-10-CM

## 2018-08-24 LAB — URINE CULTURE: Culture: >=100000 — AB

## 2018-08-24 LAB — BASIC METABOLIC PANEL
Anion gap: 6 (ref 5–15)
BUN: 12 mg/dL (ref 6–20)
CO2: 23 mmol/L (ref 22–32)
Calcium: 8.5 mg/dL — ABNORMAL LOW (ref 8.9–10.3)
Chloride: 108 mmol/L (ref 98–111)
Creatinine, Ser: 0.91 mg/dL (ref 0.61–1.24)
GFR calc non Af Amer: >60 mL/min (ref >60)
Glucose, Bld: 110 mg/dL — ABNORMAL HIGH (ref 70–99)
Potassium: 4 mmol/L (ref 3.5–5.1)
SODIUM: 137 mmol/L (ref 135–145)

## 2018-08-24 LAB — CBC WITH DIFFERENTIAL/PLATELET
Abs Immature Granulocytes: 0.02 10*3/uL (ref 0.00–0.07)
BASOS PCT: 0 %
Basophils Absolute: 0 10*3/uL (ref 0.0–0.1)
Eosinophils Absolute: 0.1 10*3/uL (ref 0.0–0.5)
Eosinophils Relative: 1 %
HCT: 27.9 % — ABNORMAL LOW (ref 39.0–52.0)
Hemoglobin: 9.3 g/dL — ABNORMAL LOW (ref 13.0–17.0)
Immature Granulocytes: 0 %
Lymphocytes Relative: 19 %
Lymphs Abs: 1.9 10*3/uL (ref 0.7–4.0)
MCH: 28.6 pg (ref 26.0–34.0)
MCHC: 33.3 g/dL (ref 30.0–36.0)
MCV: 85.8 fL (ref 80.0–100.0)
Monocytes Absolute: 0.4 10*3/uL (ref 0.1–1.0)
Monocytes Relative: 4 %
NRBC: 0 % (ref 0.0–0.2)
Neutro Abs: 7.6 10*3/uL (ref 1.7–7.7)
Neutrophils Relative %: 76 %
PLATELETS: 486 10*3/uL — AB (ref 150–400)
RBC: 3.25 MIL/uL — AB (ref 4.22–5.81)
RDW: 13 % (ref 11.5–15.5)
WBC: 10 10*3/uL (ref 4.0–10.5)

## 2018-08-24 LAB — ABO/RH: ABO/RH(D): B NEG

## 2018-08-24 LAB — GLUCOSE, CAPILLARY: Glucose-Capillary: 108 mg/dL — ABNORMAL HIGH (ref 70–99)

## 2018-08-24 MED ORDER — POLYETHYLENE GLYCOL 3350 17 G PO PACK
17.0000 g | PACK | Freq: Every day | ORAL | Status: DC
  Administered 2018-08-24 – 2018-08-25 (×2): 17 g via ORAL
  Filled 2018-08-24 (×2): qty 1

## 2018-08-24 MED ORDER — SODIUM CHLORIDE 0.9 % IV SOLN
2.0000 g | Freq: Four times a day (QID) | INTRAVENOUS | Status: DC
  Administered 2018-08-24 – 2018-08-25 (×4): 2 g via INTRAVENOUS
  Filled 2018-08-24 (×5): qty 2000

## 2018-08-24 MED ORDER — METHADONE HCL 10 MG PO TABS
90.0000 mg | ORAL_TABLET | Freq: Every day | ORAL | Status: DC
  Administered 2018-08-24 – 2018-08-25 (×2): 90 mg via ORAL
  Filled 2018-08-24 (×2): qty 9

## 2018-08-24 NOTE — Progress Notes (Addendum)
[facility] PROGRESS NOTE    Progress Note  Glen Lambert  IWP:[id number] DOB: 01/17/1991 DOA: 08/22/2018 PCP: Charlott Rakes, MD     Brief Narrative:   Glen Lambert is an 28 y.o. male past medical history of opioid dependence, nephrolithiasis, left nephroureteral stent placement who presents with left flank pain fever and chills found to have a urinary tract infection with nephrolithiasis and hydronephrosis despite recent stent placement, with a new subcapsular fluid collection  Assessment/Plan:   Sepsis secondary to UTI (HCC)/nephrolithiasis: -Patient is status post stents placement.  With right-sided nephrolithiasis in 07/12/2018 at [facility], replaced on 07/27/2018. -Urinalysis was positive for infection on this admission, CT scan showed large subscapular fluid collection in the left kidney with a left ureteral stent in place and left hydronephrosis. Urology was consulted. -Cystoscopy with left ureteral stent exchange and left retrograde pyelography performed on 08/23/2018. -Continue empiric antibiotic started on admission (cefepime).  Urine culture grew Enteroccocus faecalis sensitivities are pending. Continue methadone and morphine for pain. Tylenol for fever.  His fever is coming down his leukocytosis and resolved. He remains severely anorexic not even wanting to drink water. He relates  he does not want to eat. Due to myalgias and lousy he feels. See below.  Acute kidney injury: With a baseline creatinine of less than 1 on 05/2018. On admission 1.8. He was started on aggressive IV fluid hydration and cr return to baseline. Cont IV fluids and he has minimal oral intake.  Acute blood loss anemia: From 11.4-9.3. Subcapsular  fluid collection probable hematoma. He was typed and screened 2 units of packed red blood cells. He remained asymptomatic as hemoglobin has stabilized.  Opioid abuse, in remission (HCC)/opiate withdrawals: Resume methadone. He has symptoms of anorexia  and diffuse myalgia ? are signs of mild opiate withdrawals. We have resume his methadone. We will reevaluate tomorrow morning and see if his appetite and diffuse body aches have improved.  Thrombocytosis ([place]) Likely due to infectious etiology.   DVT prophylaxis: SCD Family Communication:none Disposition Plan/Barrier to D/C: home hopefully tmrw Code Status:     Code Status Orders  (From admission, onward)         Start     Ordered   08/22/18 2036  Full code  Continuous     08/22/18 2039        Code Status History    Date Active Date Inactive Code Status Order ID Comments User Context   04/26/2016 0738 04/29/2016 1627 Full Code [id number]  Rondel Jumbo, PA-C ED        IV Access:    Peripheral IV   Procedures and diagnostic studies:   Dg Cystogram  Result Date: 08/23/2018 CLINICAL DATA:  Left ureteral stent exchange. EXAM: DG C-ARM 61-120 MIN; CYSTOGRAM - 3+ VIEW COMPARISON:  Abdomen and pelvis CT obtained yesterday. FINDINGS: The 1st image demonstrates the previously seen left ureteral stent. The superior end of the stent is more inferiorly located on this image, at the L3 level. A 2nd image demonstrates contrast opacification of the left renal pelvis from injected contrast through a new stent alongside the old stent. 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 Subsequent images demonstrate placement of a guidewire into the superior aspect of the inferior portion of the partially duplicated left renal pelvis and removal of the previous stent. The opacified portions of the left ureter have normal appearances. The last images demonstrate a new stent in place with the  proximal and distal portions not included. IMPRESSION: Left ureteral stent exchange. Electronically Signed   By: Claudie Revering M.D.   On: 08/23/2018 20:22   Dg C-arm 1-60 Min  Result Date: 08/23/2018 CLINICAL DATA:  Left ureteral stent exchange. EXAM: DG  C-ARM 61-120 MIN; CYSTOGRAM - 3+ VIEW COMPARISON:  Abdomen and pelvis CT obtained yesterday. FINDINGS: The 1st image demonstrates the previously seen left ureteral stent. The superior end of the stent is more inferiorly located on this image, at the L3 level. A 2nd image demonstrates contrast opacification of the left renal pelvis from injected contrast through a new stent alongside the old stent. 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 Subsequent images demonstrate placement of a guidewire into the superior aspect of the inferior portion of the partially duplicated left renal pelvis and removal of the previous stent. The opacified portions of the left ureter have normal appearances. The last images demonstrate a new stent in place with the proximal and distal portions not included. IMPRESSION: Left ureteral stent exchange. Electronically Signed   By: Claudie Revering M.D.   On: 08/23/2018 20:22   Ct Renal Stone Study  Result Date: 08/22/2018 CLINICAL DATA:  Left flank pain. Recent left kidney stone removal with stent placement. EXAM: CT ABDOMEN AND PELVIS WITHOUT CONTRAST TECHNIQUE: Multidetector CT imaging of the abdomen and pelvis was performed following the standard protocol without IV contrast. COMPARISON:  04/21/2018 . FINDINGS: Lower chest: Right lung base appears clear. The left lung base is not well visualized secondary to asymmetric elevation of left hemidiaphragm. Hepatobiliary: Within the limitations of unenhanced technique there is no focal liver abnormality identified. Gallbladder unremarkable. No biliary dilatation. Pancreas: Unremarkable. No pancreatic ductal dilatation or surrounding inflammatory changes. Spleen: The spleen is displaced into the left upper quadrant of the abdomen and is only partially visualized. The visualized portions are unremarkable. Adrenals/Urinary Tract: Normal appearance of the adrenal glands. The right kidney is  unremarkable. No right kidney stone or hydronephrosis. There is a left-sided nephroureteral stent in place with. Tiny stone fragments are suspected within the mid left ureter at the L4 level, image 78/9. Large left-sided subcapsular fluid collection is identified measuring 8.9 by 5.3 by 12.8 cm, image 55/8. This measures water density. There is extension of this fluid collection into the retroperitoneum. Here, there is a large, water density fluid collection measuring 8.2 by 6.7 by 17.8 cm (volume = 510 cm^3). This fluid collection extends into the left psoas muscle which is asymmetrically enlarged. Urinary bladder appears normal. Stomach/Bowel: Stomach is within normal limits. Appendix appears normal. No evidence of bowel wall thickening, distention, or inflammatory changes. Vascular/Lymphatic: No significant vascular findings are present. No enlarged abdominal or pelvic lymph nodes. Reproductive: Prostate is unremarkable. Other: No abdominal wall hernia or abnormality. No abdominopelvic ascites. Musculoskeletal: No acute or significant osseous findings. IMPRESSION: 1. There is a large low-density subcapsular fluid collection involving the left kidney. This may represent either a large urinoma or chronic hematoma. The fluid collection extends into the left retroperitoneum where there is an approximately 510 cc fluid collection. The retroperitoneal fluid collection is also low in attenuation and may represent either a urinoma or chronic hematoma. 2. Left-sided nephroureteral stent is in place. There is left-sided hydronephrosis. Along the course of the mid left ureter there are several filling defects adjacent to the stent which may represent tiny stone fragments. Electronically Signed   By: Kerby Moors M.D.   On:  08/22/2018 18:00     Medical Consultants:    None.  Anti-Infectives:   For pain  Subjective:    Glen Lambert relates he has not had a bowel movement in over 4 days does not have an  appetite, he denies any abdominal pain but does fear myalgias all over, he relates he has been taking his methadone for 4 days.  Objective:    Vitals:   08/23/18 1900 08/23/18 1930 08/23/18 2001 08/23/18 2033  BP: (!) 150/89 (!) 145/88 (!) 151/97 (!) 144/91  Pulse: 84 90 86 81  Resp: (!) 22 (!) 21 20 (!) 22  Temp:   100 F (37.8 C) 100 F (37.8 C)  TempSrc:   Oral Oral  SpO2: 95% 97% 98% 98%  Weight:      Height:        Intake/Output Summary (Last 24 hours) at 08/24/2018 0742 Last data filed at 08/24/2018 0345 Gross per 24 hour  Intake 2213.14 ml  Output 657 ml  Net 1556.14 ml   Filed Weights   08/22/18 1718  Weight: 108.9 kg    Exam: General exam: In no acute distress. Respiratory system: Good air movement and clear to auscultation. Cardiovascular system: S1 & S2 heard, RRR.  Gastrointestinal system: Abdomen is nondistended, soft and nontender.  Central nervous system: Alert and oriented. No focal neurological deficits. Extremities: No pedal edema. Skin: No rashes, lesions or ulcers Psychiatry: Judgement and insight appear normal. Mood & affect appropriate.    Data Reviewed:    Labs: Basic Metabolic Panel: Recent Labs  Lab 08/22/18 1810 08/23/18 0020 08/24/18 0531  NA 140 138 137  K 3.6 4.0 4.0  CL 104 107 108  CO2 23 22 23   GLUCOSE 88 99 110*  BUN 16 13 12   CREATININE 1.28* 1.04 0.91  CALCIUM 9.4 8.1* 8.5*   GFR Estimated Creatinine Clearance: 158.8 mL/min (by C-G formula based on SCr of 0.91 mg/dL). Liver Function Tests: No results for input(s): AST, ALT, ALKPHOS, BILITOT, PROT, ALBUMIN in the last 168 hours. No results for input(s): LIPASE, AMYLASE in the last 168 hours. No results for input(s): AMMONIA in the last 168 hours. Coagulation profile Recent Labs  Lab 08/22/18 2141  INR 1.2    CBC: Recent Labs  Lab 08/22/18 1810 08/23/18 0020 08/23/18 2018 08/24/18 0531  WBC 12.3* 12.3*  --  10.0  NEUTROABS 9.4* 9.8*  --  7.6  HGB 11.4*  9.3* 9.5* 9.3*  HCT 36.5* 29.1* 28.3* 27.9*  MCV 88.8 86.1  --  85.8  PLT 483* 501*  --  486*   Cardiac Enzymes: No results for input(s): CKTOTAL, CKMB, CKMBINDEX, TROPONINI in the last 168 hours. BNP (last 3 results) No results for input(s): PROBNP in the last 8760 hours. CBG: Recent Labs  Lab 08/23/18 0744 08/24/18 0736  GLUCAP 105* 108*   D-Dimer: No results for input(s): DDIMER in the last 72 hours. Hgb A1c: No results for input(s): HGBA1C in the last 72 hours. Lipid Profile: No results for input(s): CHOL, HDL, LDLCALC, TRIG, CHOLHDL, LDLDIRECT in the last 72 hours. Thyroid function studies: No results for input(s): TSH, T4TOTAL, T3FREE, THYROIDAB in the last 72 hours.  Invalid input(s): FREET3 Anemia work up: No results for input(s): VITAMINB12, FOLATE, FERRITIN, TIBC, IRON, RETICCTPCT in the last 72 hours. Sepsis Labs: Recent Labs  Lab 08/22/18 1810 08/22/18 2141 08/23/18 0020 08/24/18 0531  WBC 12.3*  --  12.3* 10.0  LATICACIDVEN  --  1.4 0.8  --  Microbiology Recent Results (from the past 240 hour(s))  Urine Culture     Status: Abnormal (Preliminary result)   Collection Time: 08/22/18  6:39 PM  Result Value Ref Range Status   Specimen Description URINE, CLEAN CATCH  Final   Special Requests   Final    NONE Performed at [facility], [place] [address], [place], [place] [address]    Culture (A)  Final    ENTEROCOCCUS FAECALIS >=100,000 COLONIES/mL GRAM POSITIVE COCCI    Report Status PENDING  Incomplete  Blood culture (routine x 2)     Status: None (Preliminary result)   Collection Time: 08/22/18  7:35 PM  Result Value Ref Range Status   Specimen Description BLOOD LEFT ANTECUBITAL  Final   Special Requests   Final    BOTTLES DRAWN AEROBIC AND ANAEROBIC Blood Culture adequate volume   Culture   Final    NO GROWTH < 24 HOURS Performed at [facility], [place] [address] [place], [place] [address]    Report Status PENDING  Incomplete  Blood  culture (routine x 2)     Status: None (Preliminary result)   Collection Time: 08/22/18  7:59 PM  Result Value Ref Range Status   Specimen Description BLOOD RIGHT HAND  Final   Special Requests   Final    BOTTLES DRAWN AEROBIC AND ANAEROBIC Blood Culture adequate volume   Culture   Final    NO GROWTH < 24 HOURS Performed at [facility], [place] [address], [place], [place] [address]    Report Status PENDING  Incomplete  Surgical PCR screen     Status: None   Collection Time: 08/23/18  2:56 PM  Result Value Ref Range Status   MRSA, PCR NEGATIVE NEGATIVE Final   Staphylococcus aureus NEGATIVE NEGATIVE Final    Comment: (NOTE) The Xpert SA Assay (FDA approved for NASAL specimens in patients 69 years of age and older), is one component of a comprehensive surveillance program. It is not intended to diagnose infection nor to guide or monitor treatment. Performed at [facility], [place] [address], [place],  [address]      Medications:   . mupirocin ointment  1 application Nasal BID  . sodium chloride flush  3 mL Intravenous Q12H   Continuous Infusions: . ceFEPime (MAXIPIME) IV 2 g (08/23/18 2123)  . dextrose 5 % and 0.45 % NaCl with KCl 20 mEq/L 100 mL/hr at 08/24/18 0120  . lactated ringers 10 mL/hr at 08/23/18 1544      LOS: 1 day   Charlynne Cousins  [facility]  08/24/2018, 7:42 AM

## 2018-08-24 NOTE — Progress Notes (Signed)
S/p left stent exchange.  POD#1 Feeling better, pain improved.  Still having left flank tenderness. No nausea.  Vitals:   08/23/18 1900 08/23/18 1930 08/23/18 2001 08/23/18 2033  BP: (!) 150/89 (!) 145/88 (!) 151/97 (!) 144/91  Pulse: 84 90 86 81  Resp: (!) 22 (!) 21 20 (!) 22  Temp:   100 F (37.8 C) 100 F (37.8 C)  TempSrc:   Oral Oral  SpO2: 95% 97% 98% 98%  Weight:      Height:        Intake/Output Summary (Last 24 hours) at 08/24/2018 0742 Last data filed at 08/24/2018 0345 Gross per 24 hour  Intake 2213.14 ml  Output 657 ml  Net 1556.14 ml   NAD RRR Non-labored breathing Left CVA tenderness  Recent Labs    08/22/18 1810 08/23/18 0020 08/23/18 2018 08/24/18 0531  WBC 12.3* 12.3*  --  10.0  HGB 11.4* 9.3* 9.5* 9.3*  HCT 36.5* 29.1* 28.3* 27.9*   Recent Labs    08/22/18 1810 08/23/18 0020 08/24/18 0531  NA 140 138 137  K 3.6 4.0 4.0  CL 104 107 108  CO2 23 22 23   GLUCOSE 88 99 110*  BUN 16 13 12   CREATININE 1.28* 1.04 0.91  CALCIUM 9.4 8.1* 8.5*   Recent Labs    08/22/18 2141  INR 1.2   No results for input(s): PSA in the last 72 hours. No results for input(s): LABURIN in the last 72 hours. Results for orders placed or performed during the hospital encounter of 08/22/18  Urine Culture     Status: Abnormal (Preliminary result)   Collection Time: 08/22/18  6:39 PM  Result Value Ref Range Status   Specimen Description URINE, CLEAN CATCH  Final   Special Requests   Final    NONE Performed at [facility], [place] [address], [place], [place] [address]    Culture (A)  Final    ENTEROCOCCUS FAECALIS >=100,000 COLONIES/mL GRAM POSITIVE COCCI    Report Status PENDING  Incomplete  Blood culture (routine x 2)     Status: None (Preliminary result)   Collection Time: 08/22/18  7:35 PM  Result Value Ref Range Status   Specimen Description BLOOD LEFT ANTECUBITAL  Final   Special Requests   Final    BOTTLES DRAWN AEROBIC AND ANAEROBIC Blood Culture  adequate volume   Culture   Final    NO GROWTH < 24 HOURS Performed at [facility], [place] [address], [place], [place] [address]    Report Status PENDING  Incomplete  Blood culture (routine x 2)     Status: None (Preliminary result)   Collection Time: 08/22/18  7:59 PM  Result Value Ref Range Status   Specimen Description BLOOD RIGHT HAND  Final   Special Requests   Final    BOTTLES DRAWN AEROBIC AND ANAEROBIC Blood Culture adequate volume   Culture   Final    NO GROWTH < 24 HOURS Performed at [facility], [place] [address] [place], [place] [address]    Report Status PENDING  Incomplete  Surgical PCR screen     Status: None   Collection Time: 08/23/18  2:56 PM  Result Value Ref Range Status   MRSA, PCR NEGATIVE NEGATIVE Final   Staphylococcus aureus NEGATIVE NEGATIVE Final    Comment: (NOTE) The Xpert SA Assay (FDA approved for NASAL specimens in patients 74 years of age and older), is one component of a comprehensive surveillance program.  It is not intended to diagnose infection nor to guide or monitor treatment. Performed at [facility], [place] [address] [place], [place] [address]     Imp: Occluded left ureteral stent, ? Ureteral leak from previous stent being through side wall of ureter.  Seems to be improving now.    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  He feels better today, and in the absence of fevers, I don't think he absolutely needs to have it drained.  We will defer this for now.  Should continue with abx and f/u cultures.  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

## 2018-08-24 NOTE — Care Management Note (Signed)
Case Management Note Manya Silvas, RN MSN CCM Transitions of Care [facility] [place] [phone]  Patient Details  Name: Glen Lambert MRN: [id number] Date of Birth: [birth year]  Subjective/Objective:      Sepsis               Action/Plan: Spoke with patient and his fiance at bedside. PTA patient was independent. States he lives in [place], however, his mom lives in [place], [place] and he has been staying with her in order to help her out right now. Has transportation to get to f/u medical appointments. Gets methadone filled at [facility] [place]. States his urologist is setting him up with f/u appointment in about 3 weeks. Declined offer to schedule PCP appointment at [facility] where he has been seen before. No HH/DME needs. CM to follow for transition of care needs.   Expected Discharge Date:                  Expected Discharge Plan:  Home/Self Care  In-House Referral:  Financial Counselor  Discharge planning Services  CM Consult  Post Acute Care Choice:  NA Choice offered to:  NA  DME Arranged:  N/A DME Agency:  NA  HH Arranged:  NA HH Agency:  NA  Status of Service:  In process, will continue to follow  If discussed at Long Length of Stay Meetings, dates discussed:    Additional Comments:  Bartholomew Crews, RN 08/24/2018, 1:58 PM

## 2018-08-25 LAB — GLUCOSE, CAPILLARY: Glucose-Capillary: 102 mg/dL — ABNORMAL HIGH (ref 70–99)

## 2018-08-25 MED ORDER — AMOXICILLIN 500 MG PO CAPS: 500.0000 mg | ORAL_CAPSULE | Freq: Three times a day (TID) | ORAL | 0 refills | Status: AC

## 2018-08-25 MED ORDER — AMOXICILLIN 500 MG PO CAPS
500.0000 mg | ORAL_CAPSULE | Freq: Three times a day (TID) | ORAL | Status: DC
  Filled 2018-08-25: qty 1

## 2018-08-25 MED ORDER — PROBIOTIC 250 MG PO CAPS: 250.0000 mg | ORAL_CAPSULE | Freq: Every morning | ORAL | 0 refills | Status: AC

## 2018-08-25 NOTE — Plan of Care (Signed)
  Problem: Education: Goal: Knowledge of General Education information will improve Description Including pain rating scale, medication(s)/side effects and non-pharmacologic comfort measures Outcome: Completed/Met   Problem: Health Behavior/Discharge Planning: Goal: Ability to manage health-related needs will improve Outcome: Completed/Met   Problem: Clinical Measurements: Goal: Ability to maintain clinical measurements within normal limits will improve Outcome: Completed/Met Goal: Will remain free from infection Outcome: Completed/Met Goal: Respiratory complications will improve Outcome: Completed/Met Goal: Cardiovascular complication will be avoided Outcome: Completed/Met   Problem: Nutrition: Goal: Adequate nutrition will be maintained Outcome: Completed/Met   Problem: Elimination: Goal: Will not experience complications related to bowel motility Outcome: Completed/Met Goal: Will not experience complications related to urinary retention Outcome: Completed/Met   Problem: Pain Managment: Goal: General experience of comfort will improve Outcome: Completed/Met   Problem: Safety: Goal: Ability to remain free from injury will improve Outcome: Completed/Met

## 2018-08-25 NOTE — Discharge Summary (Signed)
Discharge Summary  Glen Lambert RCV:[id number] DOB: May 21, 1991  PCP: Charlott Rakes, MD  Admit date: 08/22/2018 Discharge date: 08/25/2018  Time spent: 9mins, more than 50% time spent on coordination of care. Case discussed with infectious disease over the phone  Recommendations for Outpatient Follow-up:  1. F/u with PCP within a week  for hospital discharge follow up, repeat cbc/bmp at follow up. pcp to monitor anemia and platelet number 2. F/u with urology Dr Louis Meckel in 10 days for left ureteral stent and fluids collection around left kidney 3. F/u with methadone clinic in [place], [place].  Discharge Diagnoses:  Active Hospital Problems   Diagnosis Date Noted  . Sepsis secondary to UTI ([place]) 08/22/2018  . Opiate withdrawal ([place]) 08/24/2018  . Hydronephrosis of left kidney 08/22/2018  . AKI (acute kidney injury) ([place]) 08/22/2018  . Normocytic anemia 08/22/2018  . Thrombocytosis ([place]) 08/22/2018  . Left Renal calculus 04/21/2018  . Opioid abuse, in remission [facility]) 04/26/2016    Resolved Hospital Problems  No resolved problems to display.    Discharge Condition: stable  Diet recommendation: regular diet  Filed Weights   08/22/18 1718  Weight: 108.9 kg    History of present illness: (per admitting MD Dr Myna Hidalgo) PCP: Charlott Rakes, MD   Patient coming from: Home   Chief Complaint: Left flank pain, fever/chills   HPI: Glen Lambert is a 28 y.o. male with medical history significant for opioid dependence, nephrolithiasis, and left nephroureteral stent placement in January, replaced in February, and now presenting to the emergency department for evaluation of left flank pain, subjective fevers, and chills.  Patient reports some left flank pain ever since January, but reports that this is worsened significantly over the past week and he has also been experiencing subjective fevers and chills over the same interval.  He has severe pain localized to the left flank, mild  dysuria, gross hematuria, no shortness of breath or significant cough, and no diarrhea.  He reports nausea with several episodes of nonbloody vomiting associated with this.  ED Course: Upon arrival to the ED, patient is found to be afebrile, saturating well on room air, tachypneic, slightly tachycardic, and with stable blood pressure.  Chemistry panel is notable for creatinine 1.28, up from 0.81 in December.  CBC is notable for leukocytosis to 12,300, mild normocytic anemia, and thrombocytosis.  Blood and urine cultures were ordered, 1 L of normal saline administered, and the patient was treated with fentanyl, morphine, and Rocephin in the ED.  Urology at [facility] was consulted by the ED physician and recommended outpatient follow-up.  Our on-call urologist was also consulted by the ED physician and did not feel that the patient required any emergent procedure.  Hospitalist were asked to admit for ongoing evaluation and management of possible sepsis secondary to UTI complicated by nephrolithiasis status post stenting.   Hospital Course:  Principal Problem:   Sepsis secondary to UTI [facility]) Active Problems:   Opioid abuse, in remission ([place])   Left Renal calculus   Hydronephrosis of left kidney   AKI (acute kidney injury) ([place])   Normocytic anemia   Thrombocytosis (HCC)   Opiate withdrawal (HCC)   Sepsis secondary to UTI (HCC)/nephrolithiasis/occluded left ureteral stent: -h/o let ureteral stent placement at [facility] in late January and early February this year, presented to the hospital this time on  3/1 due to left flank pain,fever -CT scan showed large subscapular fluid collection in the left kidney with a left ureteral stent in place and left hydronephrosis. Urology was  consulted. -Cystoscopy with left ureteral stent exchange and left retrograde pyelography performed on 08/23/2018. -he received empiric antibiotic cefepime and rocephin initially,  Urine culture grew pan sensitive Enteroccocus faecalis ,  abx changed to ampicilline. 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 On admission 1.8. He was started on aggressive IV fluid hydration and cr return to baseline. Resolved , now with normal oral intake  Normocytic anemia: From 11.4-9.3. initial hgb might be due to hemoconcentration  hgb dropped to 9.3 and stayed at 9.3 after hydration, he is asymptomatic No overt bleeding, he did not require blood transfusion pcp to follow up, suspect baseline chronic anemia  (hgb around 11 in 2017)  Thrombocytosis (HCC) Likely reactive  pcp to follow up  Opioid abuse, in remission (HCC)/maintained on methadone , he follow with methadone clinic in [place], [place] / possible opiate withdrawals: He has symptoms of anorexia and diffuse myalgia possible  of mild opiate withdrawals. Resume methadone. Symptom resolved He is to follow up with methadone clinic     DVT prophylaxis: SCD Family Communication:paitent and significant other at bedside with permission Disposition Plan/Barrier to D/C: home   Procedures:  Left ureteral stent exchange on 3/2 by urology Dr Louis Meckel  Consultations:  urology  Discharge Exam: BP (!) 145/98 (BP Location: Right Arm)   Pulse 81   Temp (!) 100.4 F (38 C) (Oral)   Resp 18   Ht 6\' 2"  (1.88 m)   Wt 108.9 kg   SpO2 98%   BMI 30.81 kg/m   General: NAD Cardiovascular: RRR Respiratory: CTABL  Discharge Instructions You were cared for by a hospitalist during your hospital stay. If you have any questions about your discharge medications or  the care you received while you were in the hospital after you are discharged, you can call the unit and asked to speak with the hospitalist on call if the hospitalist that took care of you is not available. Once you are discharged, your primary care physician will handle any further medical issues. Please note that NO REFILLS for any discharge medications will be authorized once you are discharged, as it is imperative that you return to your primary care physician (or establish a relationship with a primary care physician if you do not have one) for your aftercare needs so that they can reassess your need for medications and monitor your lab values.  Discharge Instructions    Diet general   Complete by:  As directed    Increase activity slowly   Complete by:  As directed      Allergies as of 08/25/2018   No Known Allergies     Medication List    TAKE these medications   acetaminophen 500 MG tablet Commonly known as:  TYLENOL Take 1,000 mg by mouth every 6 (six) hours as needed for headache (pain).   amoxicillin 500 MG capsule Commonly known as:  AMOXIL Take 1 capsule (500 mg total) by mouth every 8 (eight) hours for 10 days.   ibuprofen 800 MG tablet Commonly known as:  ADVIL,MOTRIN Take 1,600 mg by mouth daily as  needed (pain).   methadone 10 MG/ML solution Commonly known as:  DOLOPHINE Take 90 mg by mouth daily.   Probiotic 250 MG Caps Take 250 mg by mouth every morning for 30 days.      No Known Allergies Follow-up Information    Charlott Rakes, MD Follow up in 1 week(s).   Specialty:  Family Medicine Why:  hospital discharge follow up, repeat cbc/bmp at hospital discharge follow up. pcp to monitor anemia. Contact information: [place] [address] [phone]        [name], MD Follow up in 10 day(s).   Specialty:  Urology Why:  for left kidney stent and fluids collection around left kidney. Contact information: [place] [address] [phone]            The results of significant diagnostics from this hospitalization (including imaging, microbiology, ancillary and laboratory) are listed below for reference.    Significant Diagnostic Studies: Dg Cystogram  Result Date: 08/23/2018 CLINICAL DATA:  Left ureteral stent exchange. EXAM: DG C-ARM 61-120 MIN; CYSTOGRAM - 3+ VIEW COMPARISON:  Abdomen and pelvis CT obtained yesterday. FINDINGS: The 1st image demonstrates the previously seen left ureteral stent. The superior end of the stent is more inferiorly located on this image, at the L3 level. A 2nd image demonstrates contrast opacification of the left renal pelvis from injected contrast through a new stent alongside the old stent. 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 Subsequent images demonstrate placement of a guidewire into the superior aspect of the inferior portion of the partially duplicated left renal pelvis and removal of the previous stent. The opacified portions of the left ureter have normal appearances. The last images demonstrate a new stent in place with the proximal and distal portions not included. IMPRESSION: Left ureteral stent exchange. Electronically Signed   By: Claudie Revering M.D.   On: 08/23/2018 20:22   Dg C-arm 1-60 Min  Result Date: 08/23/2018 CLINICAL DATA:  Left ureteral stent exchange. EXAM: DG C-ARM 61-120 MIN; CYSTOGRAM - 3+ VIEW COMPARISON:  Abdomen and pelvis CT obtained yesterday. FINDINGS: The 1st image demonstrates the previously seen left ureteral stent. The superior end of the stent is more inferiorly located on this image, at the L3 level. A 2nd image demonstrates contrast opacification of the left renal pelvis from injected contrast through a new stent alongside the old stent. 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 Subsequent images demonstrate  placement of a guidewire into the superior aspect of the inferior portion of the partially duplicated left renal pelvis and removal of the previous stent. The opacified portions of the left ureter have normal appearances. The last images demonstrate a new stent in place with the proximal and distal portions not included. IMPRESSION: Left ureteral stent exchange. Electronically Signed   By: Claudie Revering M.D.   On: 08/23/2018 20:22   Ct Renal Stone Study  Result Date: 08/22/2018 CLINICAL DATA:  Left flank pain. Recent left kidney stone removal with stent placement. EXAM: CT ABDOMEN AND PELVIS WITHOUT CONTRAST TECHNIQUE: Multidetector CT imaging of the abdomen and pelvis was performed following the standard protocol without IV contrast. COMPARISON:  04/21/2018 . FINDINGS: Lower chest: Right lung base appears clear. The left lung base is not well visualized secondary to asymmetric elevation of left hemidiaphragm. Hepatobiliary: Within the limitations of unenhanced technique there  is no focal liver abnormality identified. Gallbladder unremarkable. No biliary dilatation. Pancreas: Unremarkable. No pancreatic ductal dilatation or surrounding inflammatory changes. Spleen: The spleen is displaced into the left upper quadrant of the abdomen and is only partially visualized. The visualized portions are unremarkable. Adrenals/Urinary Tract: Normal appearance of the adrenal glands. The right kidney is unremarkable. No right kidney stone or hydronephrosis. There is a left-sided nephroureteral stent in place with. Tiny stone fragments are suspected within the mid left ureter at the L4 level, image 78/9. Large left-sided subcapsular fluid collection is identified measuring 8.9 by 5.3 by 12.8 cm, image 55/8. This measures water density. There is extension of this fluid collection into the retroperitoneum. Here, there is a large, water density fluid collection measuring 8.2 by 6.7 by 17.8 cm (volume = 510 cm^3). This fluid  collection extends into the left psoas muscle which is asymmetrically enlarged. Urinary bladder appears normal. Stomach/Bowel: Stomach is within normal limits. Appendix appears normal. No evidence of bowel wall thickening, distention, or inflammatory changes. Vascular/Lymphatic: No significant vascular findings are present. No enlarged abdominal or pelvic lymph nodes. Reproductive: Prostate is unremarkable. Other: No abdominal wall hernia or abnormality. No abdominopelvic ascites. Musculoskeletal: No acute or significant osseous findings. IMPRESSION: 1. There is a large low-density subcapsular fluid collection involving the left kidney. This may represent either a large urinoma or chronic hematoma. The fluid collection extends into the left retroperitoneum where there is an approximately 510 cc fluid collection. The retroperitoneal fluid collection is also low in attenuation and may represent either a urinoma or chronic hematoma. 2. Left-sided nephroureteral stent is in place. There is left-sided hydronephrosis. Along the course of the mid left ureter there are several filling defects adjacent to the stent which may represent tiny stone fragments. Electronically Signed   By: Kerby Moors M.D.   On: 08/22/2018 18:00    Microbiology: Recent Results (from the past 240 hour(s))  Urine Culture     Status: Abnormal   Collection Time: 08/22/18  6:39 PM  Result Value Ref Range Status   Specimen Description URINE, CLEAN CATCH  Final   Special Requests   Final    NONE Performed at [facility], [place] [address], [place], [place] [address]    Culture >=100,000 COLONIES/mL ENTEROCOCCUS FAECALIS (A)  Final   Report Status 08/24/2018 FINAL  Final   Organism ID, Bacteria ENTEROCOCCUS FAECALIS (A)  Final      Susceptibility   Enterococcus faecalis - MIC*    AMPICILLIN <=2 SENSITIVE Sensitive     LEVOFLOXACIN 0.5 SENSITIVE Sensitive     NITROFURANTOIN <=16 SENSITIVE Sensitive     VANCOMYCIN 1 SENSITIVE  Sensitive     * >=100,000 COLONIES/mL ENTEROCOCCUS FAECALIS  Blood culture (routine x 2)     Status: None (Preliminary result)   Collection Time: 08/22/18  7:35 PM  Result Value Ref Range Status   Specimen Description BLOOD LEFT ANTECUBITAL  Final   Special Requests   Final    BOTTLES DRAWN AEROBIC AND ANAEROBIC Blood Culture adequate volume   Culture   Final    NO GROWTH 2 DAYS Performed at [facility], [place] [address], [place], [place] [address]    Report Status PENDING  Incomplete  Blood culture (routine x 2)     Status: None (Preliminary result)   Collection Time: 08/22/18  7:59 PM  Result Value Ref Range Status   Specimen Description BLOOD RIGHT HAND  Final   Special Requests   Final  BOTTLES DRAWN AEROBIC AND ANAEROBIC Blood Culture adequate volume   Culture   Final    NO GROWTH 2 DAYS Performed at [facility], [place] [address] [place], [place] [address]    Report Status PENDING  Incomplete  Surgical PCR screen     Status: None   Collection Time: 08/23/18  2:56 PM  Result Value Ref Range Status   MRSA, PCR NEGATIVE NEGATIVE Final   Staphylococcus aureus NEGATIVE NEGATIVE Final    Comment: (NOTE) The Xpert SA Assay (FDA approved for NASAL specimens in patients 51 years of age and older), is one component of a comprehensive surveillance program. It is not intended to diagnose infection nor to guide or monitor treatment. Performed at [facility], [place] [address], [place], [place] [address]   Urine Culture     Status: Abnormal (Preliminary result)   Collection Time: 08/23/18  5:54 PM  Result Value Ref Range Status   Specimen Description URINE, CATHETERIZED  Final   Special Requests LEFT RENAL PELVIC  Final   Culture (A)  Final    ENTEROCOCCUS FAECALIS SUSCEPTIBILITIES TO FOLLOW Performed at [facility], [place] [address], [place], [place] [address]    Report Status PENDING  Incomplete     Labs: Basic Metabolic Panel: Recent Labs  Lab  08/22/18 1810 08/23/18 0020 08/24/18 0531  NA 140 138 137  K 3.6 4.0 4.0  CL 104 107 108  CO2 23 22 23   GLUCOSE 88 99 110*  BUN 16 13 12   CREATININE 1.28* 1.04 0.91  CALCIUM 9.4 8.1* 8.5*   Liver Function Tests: No results for input(s): AST, ALT, ALKPHOS, BILITOT, PROT, ALBUMIN in the last 168 hours. No results for input(s): LIPASE, AMYLASE in the last 168 hours. No results for input(s): AMMONIA in the last 168 hours. CBC: Recent Labs  Lab 08/22/18 1810 08/23/18 0020 08/23/18 2018 08/24/18 0531  WBC 12.3* 12.3*  --  10.0  NEUTROABS 9.4* 9.8*  --  7.6  HGB 11.4* 9.3* 9.5* 9.3*  HCT 36.5* 29.1* 28.3* 27.9*  MCV 88.8 86.1  --  85.8  PLT 483* 501*  --  486*   Cardiac Enzymes: No results for input(s): CKTOTAL, CKMB, CKMBINDEX, TROPONINI in the last 168 hours. BNP: BNP (last 3 results) No results for input(s): BNP in the last 8760 hours.  ProBNP (last 3 results) No results for input(s): PROBNP in the last 8760 hours.  CBG: Recent Labs  Lab 08/23/18 0744 08/24/18 0736 08/25/18 0737  GLUCAP 105* 108* 102*       Signed:  Florencia Reasons MD, PhD  [facility] 08/25/2018, 11:15 AM

## 2018-08-26 ENCOUNTER — Other Ambulatory Visit: Payer: Self-pay | Admitting: Urology

## 2018-08-26 LAB — URINE CULTURE

## 2018-08-27 LAB — CULTURE, BLOOD (ROUTINE X 2)
CULTURE: NO GROWTH
Culture: NO GROWTH
SPECIAL REQUESTS: ADEQUATE
SPECIAL REQUESTS: ADEQUATE

## 2018-09-17 ENCOUNTER — Ambulatory Visit: Admit: 2018-09-17 | Payer: MEDICAID | Admitting: Urology

## 2018-09-17 SURGERY — CYSTOURETEROSCOPY, WITH RETROGRADE PYELOGRAM AND STENT INSERTION
Anesthesia: General | Laterality: Bilateral

## 2018-10-20 ENCOUNTER — Other Ambulatory Visit: Payer: Self-pay | Admitting: Urology

## 2018-10-20 DIAGNOSIS — N130 Hydronephrosis with ureteropelvic junction obstruction: Secondary | ICD-10-CM

## 2018-10-20 DIAGNOSIS — N201 Calculus of ureter: Secondary | ICD-10-CM

## 2018-10-20 DIAGNOSIS — N135 Crossing vessel and stricture of ureter without hydronephrosis: Secondary | ICD-10-CM

## 2018-10-22 ENCOUNTER — Ambulatory Visit (HOSPITAL_COMMUNITY): Admission: RE | Admit: 2018-10-22 | Payer: Self-pay | Source: Ambulatory Visit

## 2018-10-25 ENCOUNTER — Ambulatory Visit (HOSPITAL_COMMUNITY)
Admission: RE | Admit: 2018-10-25 | Discharge: 2018-10-25 | Disposition: A | Discharge status: Home / Self Care | Payer: Medicaid - Out of State | Source: Ambulatory Visit | Attending: Urology | Admitting: Urology

## 2018-10-25 ENCOUNTER — Encounter (HOSPITAL_COMMUNITY): Payer: Self-pay

## 2018-10-25 ENCOUNTER — Other Ambulatory Visit: Payer: Self-pay

## 2018-10-25 DIAGNOSIS — N201 Calculus of ureter: Secondary | ICD-10-CM | POA: Insufficient documentation

## 2018-10-25 DIAGNOSIS — N130 Hydronephrosis with ureteropelvic junction obstruction: Secondary | ICD-10-CM | POA: Insufficient documentation

## 2018-10-25 DIAGNOSIS — N135 Crossing vessel and stricture of ureter without hydronephrosis: Secondary | ICD-10-CM | POA: Insufficient documentation

## 2018-10-25 LAB — POCT I-STAT CREATININE: Creatinine, Ser: 1 mg/dL (ref 0.61–1.24)

## 2018-10-25 MED ORDER — SODIUM CHLORIDE (PF) 0.9 % IJ SOLN
INTRAMUSCULAR | Status: AC
  Filled 2018-10-25: qty 50

## 2018-10-25 MED ORDER — IOHEXOL 300 MG/ML  SOLN
100.0000 mL | Freq: Once | INTRAMUSCULAR | Status: AC | PRN
  Administered 2018-10-25: 100 mL via INTRAVENOUS

## 2018-10-25 MED ORDER — SODIUM CHLORIDE 0.9 % IV SOLN
INTRAVENOUS | Status: AC
  Filled 2018-10-25: qty 250

## 2018-10-27 ENCOUNTER — Other Ambulatory Visit (HOSPITAL_COMMUNITY): Payer: Self-pay | Admitting: Urology

## 2018-10-27 ENCOUNTER — Other Ambulatory Visit: Payer: Self-pay | Admitting: Urology

## 2018-10-27 DIAGNOSIS — N201 Calculus of ureter: Secondary | ICD-10-CM

## 2018-10-28 ENCOUNTER — Other Ambulatory Visit: Payer: Self-pay

## 2018-10-28 ENCOUNTER — Encounter (HOSPITAL_BASED_OUTPATIENT_CLINIC_OR_DEPARTMENT_OTHER): Payer: Self-pay | Admitting: Emergency Medicine

## 2018-10-28 ENCOUNTER — Other Ambulatory Visit: Payer: Self-pay | Admitting: Urology

## 2018-10-28 DIAGNOSIS — I10 Essential (primary) hypertension: Secondary | ICD-10-CM

## 2018-10-28 HISTORY — DX: Essential (primary) hypertension: I10

## 2018-10-28 NOTE — Progress Notes (Signed)
SPOKE WITH: patient RIDING HOME WITH: wife Ma Hillock and mother Nunzio Cory  AM MEDICATIONS: methadone NPO STATUS: npo after mn LABS: needs BMET COMMENTS/CONCERNS: instructed to complete covid testing tomorrow between 11a-3p and quarantine (execpt for doctor appts) until results received  ICE,;RN, BSN.

## 2018-10-29 ENCOUNTER — Other Ambulatory Visit (HOSPITAL_COMMUNITY)
Admission: RE | Admit: 2018-10-29 | Discharge: 2018-10-29 | Disposition: A | Discharge status: Home / Self Care | Payer: HRSA Program | Source: Ambulatory Visit | Attending: Urology | Admitting: Urology

## 2018-10-29 DIAGNOSIS — Z1159 Encounter for screening for other viral diseases: Secondary | ICD-10-CM | POA: Diagnosis present

## 2018-10-31 LAB — NOVEL CORONAVIRUS, NAA (HOSP ORDER, SEND-OUT TO REF LAB; TAT 18-24 HRS): SARS-CoV-2, NAA: NOT DETECTED

## 2018-11-02 NOTE — Progress Notes (Signed)
SPOKE W/ Moksh     SCREENING SYMPTOMS OF COVID 19:   COUGH no  RUNNY NOSE no  SORE THROAT no  NASAL CONGESTION no  SNEEZING no  SHORTNESS OF BREATH no  DIFFICULTY BREATHING no  TEMP >100.0  no  UNEXPLAINED BODY ACHES no  CHILLS no   HEADACHES  no  LOSS OF SMELL/ TASTE no    HAVE YOU OR ANY FAMILY MEMBER TRAVELLED PAST 14 DAYS OUT OF THE   COUNTY no STATE no  COUNTRY no  HAVE YOU OR ANY FAMILY MEMBER BEEN EXPOSED TO ANYONE WITH COVID 19? no

## 2018-11-03 ENCOUNTER — Ambulatory Visit (HOSPITAL_BASED_OUTPATIENT_CLINIC_OR_DEPARTMENT_OTHER)
Admission: RE | Admit: 2018-11-03 | Discharge: 2018-11-03 | Disposition: A | Discharge status: Home / Self Care | Payer: Medicaid - Out of State | Attending: Urology | Admitting: Urology

## 2018-11-03 ENCOUNTER — Encounter (HOSPITAL_BASED_OUTPATIENT_CLINIC_OR_DEPARTMENT_OTHER)
Admission: RE | Disposition: A | Discharge status: Home / Self Care | Payer: Self-pay | Source: Home / Self Care | Attending: Urology

## 2018-11-03 ENCOUNTER — Encounter (HOSPITAL_BASED_OUTPATIENT_CLINIC_OR_DEPARTMENT_OTHER): Payer: Self-pay | Admitting: Certified Registered"

## 2018-11-03 ENCOUNTER — Ambulatory Visit (HOSPITAL_BASED_OUTPATIENT_CLINIC_OR_DEPARTMENT_OTHER): Payer: Medicaid - Out of State | Admitting: Anesthesiology

## 2018-11-03 DIAGNOSIS — N132 Hydronephrosis with renal and ureteral calculous obstruction: Secondary | ICD-10-CM | POA: Insufficient documentation

## 2018-11-03 DIAGNOSIS — Z86718 Personal history of other venous thrombosis and embolism: Secondary | ICD-10-CM | POA: Insufficient documentation

## 2018-11-03 DIAGNOSIS — F17200 Nicotine dependence, unspecified, uncomplicated: Secondary | ICD-10-CM | POA: Diagnosis not present

## 2018-11-03 DIAGNOSIS — N200 Calculus of kidney: Secondary | ICD-10-CM

## 2018-11-03 DIAGNOSIS — I10 Essential (primary) hypertension: Secondary | ICD-10-CM | POA: Diagnosis not present

## 2018-11-03 DIAGNOSIS — N1330 Unspecified hydronephrosis: Secondary | ICD-10-CM

## 2018-11-03 DIAGNOSIS — Z86711 Personal history of pulmonary embolism: Secondary | ICD-10-CM | POA: Diagnosis not present

## 2018-11-03 DIAGNOSIS — Z79899 Other long term (current) drug therapy: Secondary | ICD-10-CM | POA: Diagnosis not present

## 2018-11-03 HISTORY — DX: Calculus of kidney: N20.0

## 2018-11-03 HISTORY — DX: Essential (primary) hypertension: I10

## 2018-11-03 HISTORY — DX: Dysuria: R30.0

## 2018-11-03 HISTORY — PX: CYSTOSCOPY WITH RETROGRADE PYELOGRAM, URETEROSCOPY AND STENT PLACEMENT: SHX5789

## 2018-11-03 LAB — POCT I-STAT, CHEM 8
BUN: 12 mg/dL (ref 6–20)
Calcium, Ion: 1.31 mmol/L (ref 1.15–1.40)
Chloride: 106 mmol/L (ref 98–111)
Creatinine, Ser: 0.9 mg/dL (ref 0.61–1.24)
Glucose, Bld: 101 mg/dL — ABNORMAL HIGH (ref 70–99)
HCT: 41 % (ref 39.0–52.0)
Hemoglobin: 13.9 g/dL (ref 13.0–17.0)
Potassium: 4.2 mmol/L (ref 3.5–5.1)
Sodium: 142 mmol/L (ref 135–145)
TCO2: 25 mmol/L (ref 22–32)

## 2018-11-03 SURGERY — CYSTOURETEROSCOPY, WITH RETROGRADE PYELOGRAM AND STENT INSERTION
Anesthesia: General | Site: Renal | Laterality: Bilateral

## 2018-11-03 MED ORDER — MIDAZOLAM HCL 2 MG/2ML IJ SOLN
INTRAMUSCULAR | Status: AC
  Filled 2018-11-03: qty 2

## 2018-11-03 MED ORDER — TRAMADOL HCL 50 MG PO TABS: 50.0000 mg | ORAL_TABLET | Freq: Four times a day (QID) | ORAL | 0 refills | Status: AC | PRN

## 2018-11-03 MED ORDER — PROPOFOL 10 MG/ML IV BOLUS
INTRAVENOUS | Status: AC
  Filled 2018-11-03: qty 20

## 2018-11-03 MED ORDER — IOHEXOL 300 MG/ML  SOLN
INTRAMUSCULAR | Status: DC | PRN
  Administered 2018-11-03: 50 mL

## 2018-11-03 MED ORDER — LIDOCAINE HCL URETHRAL/MUCOSAL 2 % EX GEL
CUTANEOUS | Status: DC | PRN
  Administered 2018-11-03: 1 via URETHRAL

## 2018-11-03 MED ORDER — KETOROLAC TROMETHAMINE 30 MG/ML IJ SOLN
INTRAMUSCULAR | Status: DC | PRN
  Administered 2018-11-03: 30 mg via INTRAVENOUS

## 2018-11-03 MED ORDER — ACETAMINOPHEN 500 MG PO TABS
ORAL_TABLET | ORAL | Status: AC
  Filled 2018-11-03: qty 2

## 2018-11-03 MED ORDER — SODIUM CHLORIDE 0.9 % IR SOLN
Status: DC | PRN
  Administered 2018-11-03 (×2): 3000 mL

## 2018-11-03 MED ORDER — BELLADONNA ALKALOIDS-OPIUM 16.2-60 MG RE SUPP
RECTAL | Status: DC | PRN
  Administered 2018-11-03: 1 via RECTAL

## 2018-11-03 MED ORDER — FENTANYL CITRATE (PF) 100 MCG/2ML IJ SOLN
INTRAMUSCULAR | Status: AC
  Filled 2018-11-03: qty 2

## 2018-11-03 MED ORDER — LACTATED RINGERS IV SOLN
INTRAVENOUS | Status: DC
  Administered 2018-11-03 (×2): via INTRAVENOUS
  Filled 2018-11-03: qty 1000

## 2018-11-03 MED ORDER — ACETAMINOPHEN 500 MG PO TABS
1000.0000 mg | ORAL_TABLET | Freq: Once | ORAL | Status: AC
  Administered 2018-11-03: 09:00:00 1000 mg via ORAL
  Filled 2018-11-03: qty 2

## 2018-11-03 MED ORDER — GENTAMICIN SULFATE 40 MG/ML IJ SOLN
5.0000 mg/kg | INTRAVENOUS | Status: AC
  Administered 2018-11-03: 500 mg via INTRAVENOUS
  Filled 2018-11-03 (×2): qty 12.5

## 2018-11-03 MED ORDER — ONDANSETRON HCL 4 MG/2ML IJ SOLN
INTRAMUSCULAR | Status: AC
  Filled 2018-11-03: qty 2

## 2018-11-03 MED ORDER — ONDANSETRON HCL 4 MG/2ML IJ SOLN
INTRAMUSCULAR | Status: DC | PRN
  Administered 2018-11-03: 4 mg via INTRAVENOUS

## 2018-11-03 MED ORDER — DEXAMETHASONE SODIUM PHOSPHATE 10 MG/ML IJ SOLN
INTRAMUSCULAR | Status: AC
  Filled 2018-11-03: qty 1

## 2018-11-03 MED ORDER — PHENAZOPYRIDINE HCL 200 MG PO TABS: 200.0000 mg | ORAL_TABLET | Freq: Three times a day (TID) | ORAL | 0 refills | Status: AC | PRN

## 2018-11-03 MED ORDER — MIDAZOLAM HCL 5 MG/5ML IJ SOLN
INTRAMUSCULAR | Status: DC | PRN
  Administered 2018-11-03: 2 mg via INTRAVENOUS

## 2018-11-03 MED ORDER — KETOROLAC TROMETHAMINE 30 MG/ML IJ SOLN
INTRAMUSCULAR | Status: AC
  Filled 2018-11-03: qty 1

## 2018-11-03 MED ORDER — LIDOCAINE 2% (20 MG/ML) 5 ML SYRINGE
INTRAMUSCULAR | Status: DC | PRN
  Administered 2018-11-03: 100 mg via INTRAVENOUS

## 2018-11-03 MED ORDER — DEXAMETHASONE SODIUM PHOSPHATE 10 MG/ML IJ SOLN
INTRAMUSCULAR | Status: DC | PRN
  Administered 2018-11-03: 10 mg via INTRAVENOUS

## 2018-11-03 MED ORDER — FENTANYL CITRATE (PF) 100 MCG/2ML IJ SOLN
25.0000 ug | INTRAMUSCULAR | Status: DC | PRN
  Filled 2018-11-03: qty 1

## 2018-11-03 MED ORDER — PROPOFOL 10 MG/ML IV BOLUS
INTRAVENOUS | Status: DC | PRN
  Administered 2018-11-03: 280 mg via INTRAVENOUS

## 2018-11-03 MED ORDER — FENTANYL CITRATE (PF) 100 MCG/2ML IJ SOLN
INTRAMUSCULAR | Status: DC | PRN
  Administered 2018-11-03: 50 ug via INTRAVENOUS
  Administered 2018-11-03: 25 ug via INTRAVENOUS
  Administered 2018-11-03: 50 ug via INTRAVENOUS
  Administered 2018-11-03: 25 ug via INTRAVENOUS

## 2018-11-03 MED ORDER — PROMETHAZINE HCL 25 MG/ML IJ SOLN
6.2500 mg | INTRAMUSCULAR | Status: DC | PRN
  Filled 2018-11-03: qty 1

## 2018-11-03 SURGICAL SUPPLY — 27 items
BAG DRAIN URO-CYSTO SKYTR STRL (DRAIN) ×3 IMPLANT
BASKET LASER NITINOL 1.9FR (BASKET) IMPLANT
BASKET STONE 1.7 NGAGE (UROLOGICAL SUPPLIES) ×3 IMPLANT
BASKET STONE NCOMPASS (UROLOGICAL SUPPLIES) ×3 IMPLANT
CATH URET 5FR 28IN OPEN ENDED (CATHETERS) ×3 IMPLANT
CATH URET DUAL LUMEN 6-10FR 50 (CATHETERS) IMPLANT
CLOTH BEACON ORANGE TIMEOUT ST (SAFETY) ×3 IMPLANT
EXTRACTOR STONE 1.7FRX115CM (UROLOGICAL SUPPLIES) IMPLANT
FIBER LASER TRAC TIP (UROLOGICAL SUPPLIES) IMPLANT
GLOVE BIO SURGEON STRL SZ7.5 (GLOVE) ×3 IMPLANT
GLOVE BIOGEL PI IND STRL 6.5 (GLOVE) ×1 IMPLANT
GLOVE BIOGEL PI INDICATOR 6.5 (GLOVE) ×2
GLOVE INDICATOR 6.5 STRL GRN (GLOVE) ×3 IMPLANT
GOWN STRL REUS W/ TWL LRG LVL3 (GOWN DISPOSABLE) ×1 IMPLANT
GOWN STRL REUS W/TWL LRG LVL3 (GOWN DISPOSABLE) ×2
GOWN STRL REUS W/TWL XL LVL3 (GOWN DISPOSABLE) ×3 IMPLANT
GUIDEWIRE ANG ZIPWIRE 038X150 (WIRE) IMPLANT
GUIDEWIRE STR DUAL SENSOR (WIRE) ×6 IMPLANT
IV NS IRRIG 3000ML ARTHROMATIC (IV SOLUTION) ×6 IMPLANT
KIT TURNOVER CYSTO (KITS) ×3 IMPLANT
MANIFOLD NEPTUNE II (INSTRUMENTS) ×3 IMPLANT
NS IRRIG 500ML POUR BTL (IV SOLUTION) ×3 IMPLANT
PACK CYSTO (CUSTOM PROCEDURE TRAY) ×3 IMPLANT
STENT CONTOUR 8FR X 28 (STENTS) ×3 IMPLANT
TUBE CONNECTING 12'X1/4 (SUCTIONS) ×1
TUBE CONNECTING 12X1/4 (SUCTIONS) ×2 IMPLANT
TUBING UROLOGY SET (TUBING) ×3 IMPLANT

## 2018-11-03 NOTE — H&P (Signed)
The patient is seen today in follow-up. 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 During that hospitalization I exchanged his stent, and his pain improved. His follow-up procedure to re-evaluate the ureter was delayed because of the corona virus. He still has a stent in his left kidney. Follow-up CT scan demonstrated a large perinephric abscess.   The patient was seen last week for worsening flank pain. At that time he was started on Bactrim DS b.i.d.. Since he has been started on the antibiotics, the patient states that his pain has improved. He has not had any fevers fortunately. He continues to have some left-sided flank pain. He has intermittent dysuria.     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 Patient denies frequent urination, hard to postpone urination, leakage of urine, stream starts and stops, trouble starting your stream, have to strain to urinate , and erection problems.  Gastrointestinal (Upper):   Patient denies nausea, vomiting, and indigestion/ heartburn.  Gastrointestinal (Lower):   Patient denies diarrhea and constipation.  Constitutional:   Patient denies fever, night sweats, weight loss, and fatigue.  Skin:   Patient denies skin rash/ lesion and itching.  Eyes:   Patient denies blurred vision and double vision.  Ears/ Nose/ Throat:   Patient denies sore throat and sinus problems.  Hematologic/Lymphatic:   Patient denies swollen glands and easy bruising.  Cardiovascular:   Patient  denies leg swelling and chest pains.  Respiratory:   Patient denies cough and shortness of breath.  Endocrine:   Patient denies excessive thirst.  Musculoskeletal:   Patient reports back pain. Patient denies joint pain.  Neurological:   Patient denies headaches and dizziness.  Psychologic:   Patient denies depression and anxiety.   Notes: Gets up 2-3 times nightly to urinate. Left side low back and flank pain,     VITAL SIGNS:      10/26/2018 01:44 PM  Weight 275 lb / 124.74 kg  Height 74 in / 187.96 cm  BP 152/114 mmHg  Pulse 73 /min  Temperature 98.5 F / 36.9 C  BMI 35.3 kg/m   GU PHYSICAL EXAMINATION:      Notes: Mild left CVA tenderness     PAST DATA REVIEWED:  Source Of History:  Patient  Records Review:   Previous Doctor Records, Previous Patient Records, POC Tool  Urine Test Review:   Urinalysis, Urine Culture  X-Ray Review: C.T. Abdomen/Pelvis: Reviewed Films. Discussed With Patient.     PROCEDURES:          Urinalysis w/Scope Dipstick Dipstick Cont'd Micro  Color: Yellow Bilirubin: Neg mg/dL WBC/hpf: 20 - 40/hpf  Appearance: Cloudy Ketones: Neg mg/dL RBC/hpf: >60/hpf  Specific Gravity: 1.030 Blood: 3+ ery/uL Bacteria: Few (10-25/hpf)  pH: 6.5 Protein: 2+ mg/dL Cystals: NS (Not Seen)  Glucose: Neg mg/dL Urobilinogen: 1.0 mg/dL Casts: NS (Not Seen)    Nitrites: Neg Trichomonas: Not Present    Leukocyte Esterase: 2+ leu/uL  Mucous: Not Present      Epithelial Cells: 0 - 5/hpf      Yeast: NS (Not Seen)      Sperm: Not Present    ASSESSMENT:      ICD-10 Details  1 GU:   Hydronephrosis - N13.0   2   Ureteral calculus - N20.1   3   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 please place drain, send aerobic and anerobic cultures. he will f/u with me in 2 weeks for re-evaluation.          Schedule Return  Visit/Planned Activity: 2 Weeks - Office Visit          Document Letter(s):  Created for Patient: Clinical Summary         Notes:   The patient has developed an abscess in the left perinephric space. Fortunately he has not had left fevers. His pain is been reasonably well controlled. He is currently on Bactrim. Spoke to him about treatment options and I recommended that he have this drained interventional Radiology. I will plan for him to follow up in 2 weeks, and we will repeat his imaging at that time if the drainage has subsided. Ultimately, he does need re-evaluation of the left ureter. We will discuss this in more detail at his follow-up in 2 weeks.

## 2018-11-03 NOTE — Transfer of Care (Signed)
Immediate Anesthesia Transfer of Care Note  Patient: Glen Lambert  Procedure(s) Performed: Procedure(s) (LRB): CYSTOSCOPY WITH LEFT RETROGRADE PYELOGRAM, LEFT  URETEROSCOPY, STONE REMOVAL  AND STENT EXCHANGE (Bilateral)  Patient Location: PACU  Anesthesia Type: General  Level of Consciousness: awake, oriented, sedated and patient cooperative  Airway & Oxygen Therapy: Patient Spontanous Breathing and Patient connected to face mask oxygen  Post-op Assessment: Report given to PACU RN and Post -op Vital signs reviewed and stable  Post vital signs: Reviewed and stable  Complications: No apparent anesthesia complications  Last Vitals:  Vitals Value Taken Time  BP 152/91 11/03/2018 11:49 AM  Temp    Pulse 82 11/03/2018 11:51 AM  Resp 8 11/03/2018 11:51 AM  SpO2 97 % 11/03/2018 11:51 AM  Vitals shown include unvalidated device data.  Last Pain:  Vitals:   11/03/18 0842  TempSrc: Oral  PainSc: 0-No pain      Patients Stated Pain Goal: 7 (11/03/18 2440)

## 2018-11-03 NOTE — OR Nursing (Signed)
LEFT STENT REMOVED IN OR BY DR Louis Meckel AT 1014.

## 2018-11-03 NOTE — Anesthesia Postprocedure Evaluation (Signed)
Anesthesia Post Note  Patient: Glen Lambert  Procedure(s) Performed: CYSTOSCOPY WITH LEFT RETROGRADE PYELOGRAM, LEFT  URETEROSCOPY, STONE REMOVAL  AND STENT EXCHANGE (Bilateral Renal)     Patient location during evaluation: PACU Anesthesia Type: General Level of consciousness: awake and alert Pain management: pain level controlled Vital Signs Assessment: post-procedure vital signs reviewed and stable Respiratory status: spontaneous breathing, nonlabored ventilation, respiratory function stable and patient connected to nasal cannula oxygen Cardiovascular status: blood pressure returned to baseline and stable Postop Assessment: no apparent nausea or vomiting Anesthetic complications: no    Last Vitals:  Vitals:   11/03/18 1239 11/03/18 1310  BP: (!) 155/101 (!) 138/92  Pulse: 72 70  Resp: 17 20  Temp:  36.6 C  SpO2: 98% 98%    Last Pain:  Vitals:   11/03/18 1230  TempSrc:   PainSc: 0-No pain                 Catalina Gravel

## 2018-11-03 NOTE — Anesthesia Procedure Notes (Signed)
Procedure Name: LMA Insertion Date/Time: 11/03/2018 9:59 AM Performed by: Bonney Aid, CRNA Pre-anesthesia Checklist: Patient identified, Emergency Drugs available, Suction available and Patient being monitored Patient Re-evaluated:Patient Re-evaluated prior to induction Oxygen Delivery Method: Circle system utilized Preoxygenation: Pre-oxygenation with 100% oxygen Induction Type: IV induction Ventilation: Mask ventilation without difficulty LMA: LMA inserted LMA Size: 5.0 Number of attempts: 1 Airway Equipment and Method: Bite block Placement Confirmation: positive ETCO2 Tube secured with: Tape Dental Injury: Teeth and Oropharynx as per pre-operative assessment

## 2018-11-03 NOTE — Discharge Instructions (Signed)

## 2018-11-03 NOTE — Anesthesia Preprocedure Evaluation (Addendum)
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  Reproductive/Obstetrics                            Anesthesia Physical Anesthesia Plan  ASA: II  Anesthesia Plan: General   Post-op Pain Management:    Induction: Intravenous  PONV Risk Score and Plan: 2 and Midazolam, Ondansetron and Dexamethasone  Airway Management Planned: LMA  Additional Equipment:   Intra-op Plan:   Post-operative Plan: Extubation in OR  Informed Consent: I have reviewed the patients History and Physical, chart, labs and discussed the procedure including the risks, benefits and alternatives for the proposed anesthesia with the patient or authorized representative who has indicated his/her understanding and acceptance.     Dental advisory given  Plan Discussed with: CRNA  Anesthesia Plan Comments:         Anesthesia Quick Evaluation

## 2018-11-03 NOTE — Op Note (Signed)
Preoperative diagnosis:  1. Left mid-ureteral stenosis/hydronephrosis   Postoperative diagnosis:  1. Impacted left ureteral stone   Procedure: 1. Cystoscopy, left retrograde pyelogram with interpretation 2. Left ureteral stent exchange 3. Left ureteroscopy/stone basket and removal  Surgeon: [name], MD  Anesthesia: General  Complications: None  Intraoperative findings:  #1: The retrograde pyelogram was performed using 10 cc of Omnipaque contrast.  This demonstrated a very stenotic left mid ureter all the way up to the UPJ.  The ureter down distal to this area was of normal caliber.  There was no extravasation of the contrast within the ureter or collecting system. #2: Ureteroscopy confirmed a narrowing at this area which was secondary to stone obstruction and impaction of the stone fragments within the lateral wall of the ureter. #3: After all the stones had been removed the ureter was noted to be widely patent.  There was some ureteral trauma associated with the procedure, but no contrast extravasation on completion retrograde pyelogram was noted within the collecting system. #4: An 8 French x28 cm stent was placed at the end of the case in the left ureter.  EBL: Minimal  Specimens: None  Indication: Glen Lambert is a 28 y.o. patient with a large left UPJ stone that was treated at an outside facility.  This was complicated by likely an occluded stent creating severe hydronephrosis and pain.  He subsequently developed fever.  The stent was then exchanged urgently.  Today, the patient presented for exploration of the left ureter.  After reviewing the management options for treatment, he elected to proceed with the above surgical procedure(s). We have discussed the potential benefits and risks of the procedure, side effects of the proposed treatment, the likelihood of the patient achieving the goals of the procedure, and any potential problems that might occur during the  procedure or recuperation. Informed consent has been obtained.  Description of procedure:  The patient was taken to the operating room and general anesthesia was induced.  The patient was placed in the dorsal lithotomy position, prepped and draped in the usual sterile fashion, and preoperative antibiotics were administered. A preoperative time-out was performed.   21 French 30 degrees cystoscope was gently passed through the patient's urethra and into the bladder under visual guidance.  Cystoscopy was performed in a 360 degree fashion demonstrating no significant abnormalities of the bladder mucosa.  There was a stent emanating from the patient's left ureteral orifice which was encrusted.  The stent was grasped and brought to the urethral meatus using a stent grasper under visual guidance.  I was unable to pass the wire all the way through the stent because of calcification as well as stent encrustation.  As such I repassed the cystoscope and was able to get a wire alongside the stent which I passed up to the left renal pelvis.  The stent was subsequently removed.  I then removed the cystoscope and passed in open-ended ureteral catheter over the wire and into the distal ureter.  I remove the wire performed retrograde pyelogram with the above findings.  I then repassed the wire and remove the open-ended catheter over the wire.  I then advanced a dual-lumen 6 French semirigid ureteroscope through the urethra and was able to cannulate the left ureter without difficulty.  I advanced it up to the stenotic area noted on the retrograde and encountered a significant amount of stone fragments that were impacted and obstructing the ureter.  Using an engage and subsequently in encompass basket I  was able to remove all the stone fragments and unobstruct the ureter.  Reexploration of the ureter demonstrated a widely patent ureter with no residual remaining stone fragments.  I then repeated the retrograde pyelogram noting  no significant extravasation.  I advanced the second wire up through the semirigid ureteroscope and remove the scope over the wire.  I then passed the single lumen flexible ureteroscope over the wire was able to advance this up into the patient's renal pelvis.  I explored the renal pelvis under fluoroscopic guidance noting no additional stone fragments within the kidney.  There was no other significant abnormality.  Notably, the kidney did appear to be slightly malrotated/ptotic.  I then slowly removed the ureteroscope again inspecting the ureter noting significant trauma with edema and erythema, but no extravasation or obvious ureteral injury.  I then passed in the cystoscope over the safety wire and under visual guidance with fluoroscopic assistance advanced an 8 [place] x28 cm stent over the wire and into the left renal pelvis.  Once the stent was noted to be well positioned in the kidney I advanced it up to the ureteral orifice before removing the wire completely.  It was noted to be well curled within the kidney as well as in the bladder.  The bladder was subsequently emptied and all the stone fragments were removed.  I then passed a B&O suppository into the patient's rectum and instilled lidocaine jelly into the patient's urethra.  He was subsequently extubated and returned to the PACU in stable condition.  Disposition: The patient will be scheduled for stent removal in 2 weeks.  [name], M.D.

## 2018-11-03 NOTE — Interval H&P Note (Signed)
History and Physical Interval Note:  Vitals:   10/28/18 1218 11/03/18 0842  BP:  (!) 165/116  Pulse:  75  Resp:  18  Temp:  98.7 F (37.1 C)  TempSrc:  Oral  SpO2:  100%  Weight: 124.7 kg 125 kg  Height: 6\' 2"  (1.88 m) 6\' 2"  (1.88 m)   NAD Non-labored breathing RRR   11/03/2018 9:51 AM  Glen Lambert  has presented today for surgery, with the diagnosis of LEFT URETERAL STRICTURE.  The various methods of treatment have been discussed with the patient and family. After consideration of risks, benefits and other options for treatment, the patient has consented to  Procedure(s): CYSTOSCOPY WITH BILATERAL RETROGRADE PYELOGRAM, LEFT DIAGNOSTIC URETEROSCOPY AND STENT EXCHANGE (Bilateral) as a surgical intervention.  The patient's history has been reviewed, patient examined, no change in status, stable for surgery.  I have reviewed the patient's chart and labs.  Questions were answered to the patient's satisfaction.     [name]

## 2018-11-04 ENCOUNTER — Other Ambulatory Visit: Payer: Self-pay | Admitting: Urology

## 2018-11-04 ENCOUNTER — Other Ambulatory Visit (HOSPITAL_COMMUNITY): Payer: Self-pay | Admitting: Urology

## 2018-11-04 ENCOUNTER — Encounter (HOSPITAL_BASED_OUTPATIENT_CLINIC_OR_DEPARTMENT_OTHER): Payer: Self-pay | Admitting: Urology

## 2018-11-04 DIAGNOSIS — N201 Calculus of ureter: Secondary | ICD-10-CM

## 2018-11-18 ENCOUNTER — Encounter (HOSPITAL_COMMUNITY): Payer: Self-pay

## 2018-11-18 ENCOUNTER — Other Ambulatory Visit: Payer: Self-pay

## 2018-11-18 ENCOUNTER — Ambulatory Visit (HOSPITAL_COMMUNITY)
Admission: RE | Admit: 2018-11-18 | Discharge: 2018-11-18 | Disposition: A | Discharge status: Home / Self Care | Payer: Medicaid - Out of State | Source: Ambulatory Visit | Attending: Urology | Admitting: Urology

## 2018-11-18 ENCOUNTER — Ambulatory Visit (HOSPITAL_COMMUNITY): Admission: RE | Admit: 2018-11-18 | Payer: Self-pay | Source: Ambulatory Visit

## 2018-11-18 DIAGNOSIS — N201 Calculus of ureter: Secondary | ICD-10-CM | POA: Diagnosis present

## 2019-10-27 ENCOUNTER — Emergency Department (HOSPITAL_COMMUNITY)
Admission: EM | Admit: 2019-10-27 | Discharge: 2019-10-27 | Disposition: A | Discharge status: Home / Self Care | Payer: Medicaid - Out of State | Attending: Emergency Medicine | Admitting: Emergency Medicine

## 2019-10-27 ENCOUNTER — Other Ambulatory Visit: Payer: Self-pay

## 2019-10-27 ENCOUNTER — Encounter (HOSPITAL_COMMUNITY): Payer: Self-pay

## 2019-10-27 DIAGNOSIS — K029 Dental caries, unspecified: Secondary | ICD-10-CM | POA: Diagnosis not present

## 2019-10-27 DIAGNOSIS — F17210 Nicotine dependence, cigarettes, uncomplicated: Secondary | ICD-10-CM | POA: Diagnosis not present

## 2019-10-27 DIAGNOSIS — Z79899 Other long term (current) drug therapy: Secondary | ICD-10-CM | POA: Diagnosis not present

## 2019-10-27 DIAGNOSIS — Z9660 Presence of unspecified orthopedic joint implant: Secondary | ICD-10-CM | POA: Diagnosis not present

## 2019-10-27 DIAGNOSIS — I10 Essential (primary) hypertension: Secondary | ICD-10-CM | POA: Diagnosis not present

## 2019-10-27 DIAGNOSIS — K0889 Other specified disorders of teeth and supporting structures: Secondary | ICD-10-CM | POA: Diagnosis present

## 2019-10-27 MED ORDER — PENICILLIN V POTASSIUM 500 MG PO TABS
500.0000 mg | ORAL_TABLET | Freq: Once | ORAL | Status: AC
  Administered 2019-10-27: 500 mg via ORAL
  Filled 2019-10-27: qty 1

## 2019-10-27 MED ORDER — PENICILLIN V POTASSIUM 500 MG PO TABS: 500.0000 mg | ORAL_TABLET | Freq: Three times a day (TID) | ORAL | 0 refills | Status: AC

## 2019-10-27 MED ORDER — IBUPROFEN 800 MG PO TABS
800.0000 mg | ORAL_TABLET | Freq: Once | ORAL | Status: AC
  Administered 2019-10-27: 23:00:00 800 mg via ORAL
  Filled 2019-10-27: qty 1

## 2019-10-27 MED ORDER — IBUPROFEN 800 MG PO TABS: 800.0000 mg | ORAL_TABLET | Freq: Three times a day (TID) | ORAL | 0 refills | Status: AC

## 2019-10-27 NOTE — ED Provider Notes (Signed)
[facility] DEPT Provider Note   CSN: [id number] Arrival date & time: 10/27/19  1923     History Chief Complaint  Patient presents with  . Dental Pain    Glen Lambert is a 29 y.o. male presents to the Emergency Department complaining of gradual, persistent, progressively worsening left upper and left lower dental pain onset several days ago.  Pt reports he has had several broken teeth and cavities, but has been financially unable to see a dentist.  Pt reports he took a dose of tylenol this afternoon without relief.  Eating and drinking makes the symptoms worse.  Nothing makes the pain better.  Pt denies dysphagia, difficulty speaking, throat swelling, fever and chills.   The history is provided by the patient and medical records. No language interpreter was used.       Past Medical History:  Diagnosis Date  . Dysuria    on occ; relates this to the stent that was placed 08-23-2018   . Hypertension 10/28/2018   reports dx in ER early 2019 . had no insurance so did not go to doctor at the time for f/u/ reports no cardiac sx, just received his medicaid card and is planning on establishing care with a provider   . Kidney stone   . 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 Opioid abuse, in remission ([name])   . Pulmonary embolism ([place]) 2018   unprovoked PE and LL DVT . 1 month on anticoagulant. no reoccurrence since then    Patient Active Problem List   Diagnosis Date Noted  . Opiate withdrawal ([name]) 08/24/2018  . Sepsis secondary to UTI ([name]) 08/22/2018  . Hydronephrosis of left kidney 08/22/2018  . AKI (acute kidney injury) ([name]) 08/22/2018  . Normocytic anemia 08/22/2018  . Thrombocytosis ([name]) 08/22/2018  . Left flank pain   . Lower abdominal pain 04/21/2018  . Left Renal calculus 04/21/2018  . History of deep venous thrombosis (DVT) of distal vein of left lower extremity 04/30/2016  . History of pulmonary embolism 04/26/2016  . Opioid  abuse, in remission ([name]) 04/26/2016  . 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 Tobacco abuse 04/26/2016    Past Surgical History:  Procedure Laterality Date  . ANKLE SURGERY    . CYSTOSCOPY W/ URETERAL STENT PLACEMENT Left 08/23/2018   Procedure: CYSTOSCOPY WITH RETROGRADE PYELOGRAM/URETERAL STENT PLACEMENT;  Surgeon: [name], MD;  Location: [facility];  Service: Urology;  Laterality: Left;  . CYSTOSCOPY W/ URETERAL STENT PLACEMENT  07/2018   at [facility]   . CYSTOSCOPY WITH RETROGRADE PYELOGRAM, URETEROSCOPY AND STENT PLACEMENT Bilateral 11/03/2018   Procedure: CYSTOSCOPY WITH LEFT RETROGRADE PYELOGRAM, LEFT  URETEROSCOPY, STONE REMOVAL  AND STENT EXCHANGE;  Surgeon: [name], MD;  Location: [facility];  Service: Urology;  Laterality: Bilateral;  . JOINT REPLACEMENT         Family History  Problem Relation Age of Onset  . Pulmonary embolism Maternal Grandfather     Social History   Tobacco Use  . Smoking status: Current Some Day Smoker    Packs/day: 0.25    Years: 12.00    Pack years: 3.00    Types: Cigarettes  . Smokeless tobacco: Former Systems developer  . Tobacco comment: splits one pack over 2 days   Substance Use Topics  . Alcohol use: Not Currently    Alcohol/week: 1.0 standard drinks    Types: 1 Cans of beer per week    Comment: Occasional  .  Drug use: No    Comment: Hx of Opioid abuse.     Home Medications Prior to Admission medications   Medication Sig Start Date End Date Taking? Authorizing Provider  acetaminophen (TYLENOL) 500 MG tablet Take 1,000 mg by mouth every 6 (six) hours as needed for headache (pain).    [provider]  ibuprofen (ADVIL) 800 MG tablet Take 1 tablet (800 mg total) by mouth 3 (three) times daily. 10/27/19   [name], [name], PA-C  methadone (DOLOPHINE) 10 MG/ML solution Take 90 mg by mouth daily.     [provider]  penicillin v potassium (VEETID) 500 MG tablet Take 1  tablet (500 mg total) by mouth 3 (three) times daily. 10/27/19   [name], [name], PA-C  phenazopyridine (PYRIDIUM) 200 MG tablet Take 1 tablet (200 mg total) by mouth 3 (three) times daily as needed for pain. 11/03/18   [name], MD  traMADol (ULTRAM) 50 MG tablet Take 1-2 tablets (50-100 mg total) by mouth every 6 (six) hours as needed for moderate pain. 11/03/18   [name], MD    Allergies    Other  Review of Systems   Review of Systems  Constitutional: Negative for chills and fever.  HENT: Positive for dental problem. Negative for facial swelling.   Eyes: Negative for visual disturbance.  Gastrointestinal: Negative for abdominal pain, nausea and vomiting.  Musculoskeletal: Negative for neck pain and neck stiffness.  Neurological: Positive for headaches.    Physical Exam Updated Vital Signs BP (!) 152/118 (BP Location: Left Arm)   Pulse (!) 101   Temp 98.8 F (37.1 C) (Oral)   Resp 17   Ht 6\' 2"  (1.88 m)   Wt 127 kg   SpO2 98%   BMI 35.95 kg/m   Physical Exam Vitals and nursing note reviewed.  Constitutional:      Appearance: He is well-developed.  HENT:     Head: Normocephalic.     Right Ear: Tympanic membrane, ear canal and external ear normal.     Left Ear: Tympanic membrane, ear canal and external ear normal.     Nose: Nose normal.     Right Sinus: No maxillary sinus tenderness or frontal sinus tenderness.     Left Sinus: No maxillary sinus tenderness or frontal sinus tenderness.     Mouth/Throat:     Mouth: No lacerations or oral lesions.     Dentition: Abnormal dentition. Does not have dentures. Dental tenderness, gingival swelling and dental caries present. No dental abscesses.     Pharynx: Uvula midline. No oropharyngeal exudate, posterior oropharyngeal erythema or uvula swelling.     Tonsils: No tonsillar abscesses.      Comments: Poor dentition throughout, numerous caries. Eyes:     General:        Right eye: No discharge.         Left eye: No discharge.     Conjunctiva/sclera: Conjunctivae normal.  Neck:     Comments: No stridor Handling secretions without difficulty No nuchal rigidity No cervical lymphadenopathy Cardiovascular:     Rate and Rhythm: Normal rate and regular rhythm.  Pulmonary:     Effort: Pulmonary effort is normal. No respiratory distress.  Abdominal:     General: There is no distension.  Musculoskeletal:     Cervical back: Normal range of motion and neck supple.  Lymphadenopathy:     Cervical: No cervical adenopathy.  Skin:    General: Skin is warm and dry.  Neurological:  Mental Status: He is alert.     ED Results / Procedures / Treatments    Procedures Procedures (including critical care time)  Medications Ordered in ED Medications  ibuprofen (ADVIL) tablet 800 mg (800 mg Oral Given 10/27/19 2238)  penicillin v potassium (VEETID) tablet 500 mg (500 mg Oral Given 10/27/19 2238)    ED Course  I have reviewed the triage vital signs and the nursing notes.  Pertinent labs & imaging results that were available during my care of the patient were reviewed by me and considered in my medical decision making (see chart for details).  Clinical Course as of Oct 27 2242  Thu Oct 27, 2019  2240 Tachycardic at triage, no tachycardia on physical exam  Pulse Rate(!): 101 [HM]  2241 HTN noted - pt will need f/u with PCP  BP(!): 152/118 [HM]    Clinical Course User Index [HM] Diania Co, Gwenlyn Perking   MDM Rules/Calculators/A&P                       Patient with toothache.  No gross abscess.  Exam unconcerning for Ludwig's angina or spread of infection.  Will treat with penicillin and anti-inflammatories medicine.  Urged patient to follow-up with dentist.    Pt also with noted HTN.  No evidence of hypertensive emergency. Pt will need close follow-up with PCP.    Final Clinical Impression(s) / ED Diagnoses Final diagnoses:  Pain due to dental caries  Hypertension, unspecified type      Rx / DC Orders ED Discharge Orders         Ordered    ibuprofen (ADVIL) 800 MG tablet  3 times daily     10/27/19 2242    penicillin v potassium (VEETID) 500 MG tablet  3 times daily     10/27/19 2242           Maiyah Goyne, Gwenlyn Perking 10/27/19 2244    Blanchie Dessert, MD 10/28/19 0000

## 2019-10-27 NOTE — Discharge Instructions (Signed)
1. Medications: Alternate 1000mg  Tylenol and 800mg  Ibuprofen every 3 hours - DO NOT exceed 4g of Tylenol or 3 doses of ibuprofen in 24 hours, penicillin is your antibiotic - please complete the course, usual home medications 2. Treatment: rest, drink plenty of fluids, take medications as prescribed 3. Follow Up: Please followup with dentistry within 1 week for discussion of your diagnoses and further evaluation after today's visit; if you do not have a primary care doctor use the resource guide provided to find one; Return to the ER for high fevers, difficulty breathing, difficulty swallowing or other concerning symptoms

## 2019-10-27 NOTE — ED Triage Notes (Signed)
Left upper and right upper. Here to see a dentist on call per pt

## 2023-03-17 ENCOUNTER — Encounter (HOSPITAL_BASED_OUTPATIENT_CLINIC_OR_DEPARTMENT_OTHER): Payer: Medicaid - Out of State | Attending: General Surgery | Admitting: Internal Medicine

## 2023-04-07 ENCOUNTER — Ambulatory Visit (HOSPITAL_BASED_OUTPATIENT_CLINIC_OR_DEPARTMENT_OTHER): Payer: Medicaid - Out of State | Admitting: General Surgery
